# Patient Record
Sex: Female | Born: 1935 | Race: White | Hispanic: No | Marital: Married | Smoking: Former smoker
Health system: Southern US, Community
[De-identification: ages and names within clinical notes are randomized; demographics above are authoritative.]

## PROBLEM LIST (undated history)

## (undated) DIAGNOSIS — F32A Depression, unspecified: Secondary | ICD-10-CM

## (undated) DIAGNOSIS — N200 Calculus of kidney: Secondary | ICD-10-CM

## (undated) DIAGNOSIS — I10 Essential (primary) hypertension: Secondary | ICD-10-CM

## (undated) DIAGNOSIS — E119 Type 2 diabetes mellitus without complications: Secondary | ICD-10-CM

## (undated) DIAGNOSIS — E7800 Pure hypercholesterolemia, unspecified: Secondary | ICD-10-CM

## (undated) DIAGNOSIS — M4800 Spinal stenosis, site unspecified: Secondary | ICD-10-CM

## (undated) DIAGNOSIS — G4730 Sleep apnea, unspecified: Secondary | ICD-10-CM

## (undated) DIAGNOSIS — F329 Major depressive disorder, single episode, unspecified: Secondary | ICD-10-CM

## (undated) DIAGNOSIS — Z860100 Personal history of colon polyps, unspecified: Secondary | ICD-10-CM

## (undated) DIAGNOSIS — H3530 Unspecified macular degeneration: Secondary | ICD-10-CM

## (undated) DIAGNOSIS — D649 Anemia, unspecified: Secondary | ICD-10-CM

## (undated) DIAGNOSIS — J449 Chronic obstructive pulmonary disease, unspecified: Secondary | ICD-10-CM

## (undated) DIAGNOSIS — E039 Hypothyroidism, unspecified: Secondary | ICD-10-CM

## (undated) HISTORY — PX: TONSILLECTOMY: SUR1361

## (undated) HISTORY — PX: DILATION AND CURETTAGE OF UTERUS: SHX78

## (undated) HISTORY — DX: Anemia, unspecified: D64.9

## (undated) HISTORY — PX: ABDOMINAL HYSTERECTOMY: SHX81

## (undated) HISTORY — PX: THYROID SURGERY: SHX805

## (undated) HISTORY — DX: Major depressive disorder, single episode, unspecified: F32.9

## (undated) HISTORY — DX: Pure hypercholesterolemia, unspecified: E78.00

## (undated) HISTORY — DX: Personal history of colonic polyps: Z86.010

## (undated) HISTORY — DX: Chronic obstructive pulmonary disease, unspecified: J44.9

## (undated) HISTORY — DX: Sleep apnea, unspecified: G47.30

## (undated) HISTORY — DX: Calculus of kidney: N20.0

## (undated) HISTORY — DX: Hypothyroidism, unspecified: E03.9

## (undated) HISTORY — DX: Essential (primary) hypertension: I10

## (undated) HISTORY — DX: Spinal stenosis, site unspecified: M48.00

## (undated) HISTORY — DX: Personal history of colon polyps, unspecified: Z86.0100

## (undated) HISTORY — DX: Depression, unspecified: F32.A

## (undated) HISTORY — DX: Unspecified macular degeneration: H35.30

## (undated) HISTORY — DX: Type 2 diabetes mellitus without complications: E11.9

---

## 1990-05-31 HISTORY — PX: CATARACT EXTRACTION: SUR2

## 1994-05-31 HISTORY — PX: OTHER SURGICAL HISTORY: SHX169

## 1997-07-09 ENCOUNTER — Ambulatory Visit (HOSPITAL_COMMUNITY): Admission: RE | Admit: 1997-07-09 | Discharge: 1997-07-09 | Payer: Self-pay | Admitting: Internal Medicine

## 1999-09-01 ENCOUNTER — Encounter: Payer: Self-pay | Admitting: Internal Medicine

## 1999-09-01 ENCOUNTER — Encounter: Admission: RE | Admit: 1999-09-01 | Discharge: 1999-09-01 | Payer: Self-pay | Admitting: Internal Medicine

## 2003-06-01 ENCOUNTER — Other Ambulatory Visit: Payer: Self-pay

## 2004-07-23 ENCOUNTER — Ambulatory Visit: Payer: Self-pay | Admitting: Internal Medicine

## 2005-04-10 ENCOUNTER — Other Ambulatory Visit: Payer: Self-pay

## 2005-04-10 ENCOUNTER — Emergency Department: Payer: Self-pay | Admitting: Emergency Medicine

## 2005-08-03 ENCOUNTER — Ambulatory Visit: Payer: Self-pay | Admitting: Internal Medicine

## 2005-09-23 ENCOUNTER — Ambulatory Visit: Payer: Self-pay | Admitting: Gastroenterology

## 2006-03-21 ENCOUNTER — Encounter: Payer: Self-pay | Admitting: Specialist

## 2006-03-31 ENCOUNTER — Encounter: Payer: Self-pay | Admitting: Specialist

## 2006-04-30 ENCOUNTER — Encounter: Payer: Self-pay | Admitting: Specialist

## 2006-05-31 ENCOUNTER — Encounter: Payer: Self-pay | Admitting: Specialist

## 2006-07-01 ENCOUNTER — Encounter: Payer: Self-pay | Admitting: Specialist

## 2006-08-09 ENCOUNTER — Ambulatory Visit: Payer: Self-pay | Admitting: Internal Medicine

## 2006-09-05 ENCOUNTER — Ambulatory Visit: Payer: Self-pay | Admitting: Gastroenterology

## 2006-10-05 ENCOUNTER — Ambulatory Visit: Payer: Self-pay | Admitting: Unknown Physician Specialty

## 2007-08-03 ENCOUNTER — Ambulatory Visit: Payer: Self-pay | Admitting: Physician Assistant

## 2007-08-15 ENCOUNTER — Inpatient Hospital Stay: Payer: Self-pay | Admitting: Internal Medicine

## 2007-09-19 ENCOUNTER — Ambulatory Visit: Payer: Self-pay | Admitting: Internal Medicine

## 2007-10-20 ENCOUNTER — Ambulatory Visit: Payer: Self-pay | Admitting: Unknown Physician Specialty

## 2007-10-30 ENCOUNTER — Ambulatory Visit: Payer: Self-pay | Admitting: Unknown Physician Specialty

## 2007-11-10 ENCOUNTER — Ambulatory Visit: Payer: Self-pay | Admitting: Gastroenterology

## 2007-11-29 ENCOUNTER — Ambulatory Visit: Payer: Self-pay | Admitting: Unknown Physician Specialty

## 2007-12-16 ENCOUNTER — Ambulatory Visit: Payer: Self-pay | Admitting: Internal Medicine

## 2008-03-13 ENCOUNTER — Inpatient Hospital Stay: Payer: Self-pay | Admitting: Internal Medicine

## 2009-02-10 ENCOUNTER — Ambulatory Visit: Payer: Self-pay | Admitting: Internal Medicine

## 2009-05-08 ENCOUNTER — Ambulatory Visit: Payer: Self-pay | Admitting: Specialist

## 2009-06-05 LAB — HM PAP SMEAR: HM Pap smear: NEGATIVE

## 2010-06-05 LAB — HM DEXA SCAN

## 2010-07-28 ENCOUNTER — Ambulatory Visit: Payer: Self-pay | Admitting: Gastroenterology

## 2010-07-28 LAB — HM COLONOSCOPY

## 2010-07-30 LAB — PATHOLOGY REPORT

## 2011-04-25 ENCOUNTER — Inpatient Hospital Stay: Payer: Self-pay | Admitting: Internal Medicine

## 2011-04-25 ENCOUNTER — Ambulatory Visit: Payer: Self-pay

## 2011-07-02 ENCOUNTER — Ambulatory Visit: Payer: Self-pay | Admitting: Internal Medicine

## 2011-07-07 ENCOUNTER — Ambulatory Visit: Payer: Self-pay | Admitting: Internal Medicine

## 2011-07-07 LAB — HM MAMMOGRAPHY

## 2012-02-16 ENCOUNTER — Ambulatory Visit: Payer: Self-pay | Admitting: Surgery

## 2012-03-01 ENCOUNTER — Ambulatory Visit: Payer: Self-pay | Admitting: Internal Medicine

## 2012-03-06 ENCOUNTER — Ambulatory Visit: Payer: Self-pay

## 2012-03-06 LAB — URINALYSIS, COMPLETE
Nitrite: NEGATIVE
Ph: 5 (ref 4.5–8.0)
Protein: NEGATIVE

## 2012-03-08 LAB — URINE CULTURE

## 2012-03-09 ENCOUNTER — Other Ambulatory Visit: Payer: Self-pay | Admitting: *Deleted

## 2012-03-09 MED ORDER — FENOFIBRATE 160 MG PO TABS: 160.0000 mg | ORAL_TABLET | Freq: Every day | ORAL | Status: DC

## 2012-03-09 MED ORDER — LISINOPRIL 40 MG PO TABS: 40.0000 mg | ORAL_TABLET | Freq: Every day | ORAL | Status: DC

## 2012-03-09 MED ORDER — LEVOTHYROXINE SODIUM 25 MCG PO TABS: 25.0000 ug | ORAL_TABLET | Freq: Every day | ORAL | Status: DC

## 2012-03-09 NOTE — Telephone Encounter (Signed)
Faxed refill request received from pharmacy for LEVOTHYROXINE, LISINOPRIL, FENOFIBRATE Follow up 03/21/12 RX sent per faxed order signed by Dr.Scott.

## 2012-03-15 ENCOUNTER — Ambulatory Visit: Payer: Self-pay | Admitting: Family Medicine

## 2012-03-15 LAB — URINALYSIS, COMPLETE
Bilirubin,UR: NEGATIVE
Glucose,UR: NEGATIVE mg/dL (ref 0–75)
Protein: NEGATIVE
Specific Gravity: 1.01 (ref 1.003–1.030)

## 2012-03-16 LAB — URINE CULTURE

## 2012-03-21 ENCOUNTER — Ambulatory Visit: Payer: Self-pay | Admitting: Internal Medicine

## 2012-03-23 ENCOUNTER — Ambulatory Visit: Payer: Self-pay | Admitting: Surgery

## 2012-03-24 ENCOUNTER — Ambulatory Visit: Payer: Self-pay | Admitting: Internal Medicine

## 2012-04-10 ENCOUNTER — Telehealth: Payer: Self-pay | Admitting: Internal Medicine

## 2012-04-10 NOTE — Telephone Encounter (Signed)
Called pt and discussed.  Is seeing Dr Katrinka Blazing.  Planning for breast surgery.  Informed her to let Dr Meredeth Ide know of upcoming surgery - for pre op recs.  Also informed her to contact Dr Gwen Pounds for cardiac pre op.

## 2012-04-10 NOTE — Telephone Encounter (Signed)
Pt was wanting a call back concerning surgery that is coming up.

## 2012-04-12 ENCOUNTER — Ambulatory Visit: Payer: Self-pay | Admitting: Surgery

## 2012-04-13 ENCOUNTER — Telehealth: Payer: Self-pay | Admitting: Internal Medicine

## 2012-04-13 NOTE — Telephone Encounter (Signed)
Daughter took her to acute care, they gave patient Augmentin. Patient was up at 1:00am  With vomiting , diarrhea, nausea. Daughter wants to know if you could sent her in something else for cough, congestin, sore throat. Daughter unable to bring patient in because of work. [facility] can deliver. Patient is to have surgery on 11/20. Daughter would like a call back before 2:45. Please advise

## 2012-04-13 NOTE — Telephone Encounter (Signed)
Caller: Sharon/Child; Phone: [phone]; Reason for Call: Daughter wanting to speak with someone regarding medications and visit yesterday to the acute care because patient is not able to come in today since no one can bring her.

## 2012-04-13 NOTE — Telephone Encounter (Signed)
If was seen at Acute Care - needs to call acute care and let them know what problems having with med and they can change her.  Since they saw her - they will know what would be the best med to change to.

## 2012-04-13 NOTE — Telephone Encounter (Signed)
Called patients daughter back to inform her to call acute care. She stated that she would.

## 2012-04-13 NOTE — Telephone Encounter (Signed)
Please call daughter and find out what information and what questions they have.

## 2012-04-24 ENCOUNTER — Telehealth: Payer: Self-pay | Admitting: Internal Medicine

## 2012-04-24 NOTE — Telephone Encounter (Signed)
Cell # [phone] Jasmine December pt daughter called wanted to see if we could r/s her appointment to 05/03/12.   You only have 3 openings for that day left

## 2012-04-24 NOTE — Telephone Encounter (Signed)
You can move her to 12:00 on 05/03/12.  Thanks.

## 2012-04-24 NOTE — Telephone Encounter (Signed)
Pt daughter aware of appointment date and time change

## 2012-04-28 DIAGNOSIS — R002 Palpitations: Secondary | ICD-10-CM | POA: Insufficient documentation

## 2012-04-28 DIAGNOSIS — G4730 Sleep apnea, unspecified: Secondary | ICD-10-CM | POA: Insufficient documentation

## 2012-04-28 DIAGNOSIS — I10 Essential (primary) hypertension: Secondary | ICD-10-CM

## 2012-04-28 DIAGNOSIS — E119 Type 2 diabetes mellitus without complications: Secondary | ICD-10-CM | POA: Insufficient documentation

## 2012-04-28 DIAGNOSIS — E785 Hyperlipidemia, unspecified: Secondary | ICD-10-CM

## 2012-05-01 ENCOUNTER — Ambulatory Visit: Payer: Self-pay | Admitting: Internal Medicine

## 2012-05-03 ENCOUNTER — Encounter: Payer: Self-pay | Admitting: Internal Medicine

## 2012-05-03 ENCOUNTER — Ambulatory Visit (INDEPENDENT_AMBULATORY_CARE_PROVIDER_SITE_OTHER): Payer: Medicare Other | Admitting: Internal Medicine

## 2012-05-03 VITALS — BP 130/62 | HR 100 | Temp 98.5°F | Ht 62.0 in | Wt 199.8 lb

## 2012-05-03 DIAGNOSIS — E785 Hyperlipidemia, unspecified: Secondary | ICD-10-CM

## 2012-05-03 DIAGNOSIS — E039 Hypothyroidism, unspecified: Secondary | ICD-10-CM

## 2012-05-03 DIAGNOSIS — F329 Major depressive disorder, single episode, unspecified: Secondary | ICD-10-CM

## 2012-05-03 DIAGNOSIS — E7800 Pure hypercholesterolemia, unspecified: Secondary | ICD-10-CM

## 2012-05-03 DIAGNOSIS — E119 Type 2 diabetes mellitus without complications: Secondary | ICD-10-CM

## 2012-05-03 DIAGNOSIS — G4730 Sleep apnea, unspecified: Secondary | ICD-10-CM

## 2012-05-03 DIAGNOSIS — R002 Palpitations: Secondary | ICD-10-CM

## 2012-05-03 DIAGNOSIS — J449 Chronic obstructive pulmonary disease, unspecified: Secondary | ICD-10-CM

## 2012-05-03 DIAGNOSIS — I10 Essential (primary) hypertension: Secondary | ICD-10-CM

## 2012-05-03 DIAGNOSIS — D649 Anemia, unspecified: Secondary | ICD-10-CM | POA: Insufficient documentation

## 2012-05-03 MED ORDER — PANTOPRAZOLE SODIUM 40 MG PO TBEC: 40.0000 mg | DELAYED_RELEASE_TABLET | Freq: Every day | ORAL | Status: DC

## 2012-05-03 NOTE — Patient Instructions (Addendum)
It was nice seeing you today.  Keep me posted regarding your upcoming procedure.

## 2012-05-03 NOTE — Assessment & Plan Note (Addendum)
On oxygen.  Sees Dr Meredeth Ide.  Feels her breathing is stable. Discussed with her today regarding the need for pulmonary evaluation/recs prior to her surgery.  Follow.  Continue current inhaler regimen.

## 2012-05-03 NOTE — Progress Notes (Signed)
Subjective:    Patient ID: Elizabeth [name], female    DOB: 01-05-1926, 78 y.o.   MRN: [id number]  HPI 78 year old female with past history of COPD on oxygen, diabetes, hypertension, hypercholesterolemia and hypothyroidism who comes in today for a scheduled follow up.  Accompanied by her daughter.  History obtained from both of them.  States overall she is doing better. Husband died this summer.  She appears to be coping better.  Still cries easily.  Sees Dr Elesa Massed for her psychiatry care.  Feels she is doing better.  More active around the house.  AM sugars averaging 130s and pm sugars 130-140.  No chest pain or tightness.  Breathing stable.  She was just recently diagnosed with breast cancer.  Planning for surgery soon.  Has seen Dr Gwen Pounds.  See his note for details.  Overall she feels she is doing well.   Past Medical History  Diagnosis Date  . Diabetes mellitus     diabetic retinopathy  . Hypertension   . Hypercholesterolemia   . Hypothyroidism   . Depression   . History of colonic polyps   . COPD (chronic obstructive pulmonary disease)   . Anemia   . Sleep apnea   . Spinal stenosis   . Nephrolithiasis   . Macular degeneration     Current Outpatient Prescriptions on File Prior to Visit  Medication Sig Dispense Refill  . albuterol-ipratropium (COMBIVENT) 18-103 MCG/ACT inhaler Inhale 2 puffs into the lungs 2 (two) times daily.      Marland Kitchen amLODipine (NORVASC) 2.5 MG tablet Take 2.5 mg by mouth daily.      Marland Kitchen aspirin 81 MG tablet Take 81 mg by mouth daily.      Marland Kitchen buPROPion (WELLBUTRIN) 100 MG tablet Take 100 mg by mouth 2 (two) times daily.      . Calcium Carb-Cholecalciferol (PRONUTRIENTS CALCIUM+D3 PO) Take by mouth.      . DOXEPIN HCL PO Take by mouth.      . fenofibrate 160 MG tablet Take 1 tablet (160 mg total) by mouth daily.  90 tablet  1  . fluticasone (FLONASE) 50 MCG/ACT nasal spray Place 2 sprays into the nose daily.      . Fluticasone-Salmeterol (ADVAIR) 250-50 MCG/DOSE AEPB  Inhale 1 puff into the lungs every 12 (twelve) hours.      . gabapentin (NEURONTIN) 100 MG capsule Take 100 mg by mouth daily.      . Glucose Blood (BAYER BREEZE 2 TEST VI) 1 strip by In Vitro route 2 (two) times daily. Dx code 250.01      . insulin aspart (NOVOLOG FLEXPEN) 100 UNIT/ML injection Inject into the skin 3 (three) times daily before meals. Inject 4 units at breakfast and 6 units at lunch and supper      . Insulin Pen Needle (B-D UF III MINI PEN NEEDLES) 31G X 5 MM MISC 1 each by Does not apply route 3 (three) times daily.      Marland Kitchen levothyroxine (LEVOTHROID) 25 MCG tablet Take 1 tablet (25 mcg total) by mouth daily.  90 tablet  1  . lisinopril (PRINIVIL,ZESTRIL) 40 MG tablet Take 1 tablet (40 mg total) by mouth daily.  90 tablet  1  . nystatin (MYCOSTATIN) 100000 UNIT/ML suspension Take 4 mLs by mouth 4 (four) times daily.      . pantoprazole (PROTONIX) 40 MG tablet Take 1 tablet (40 mg total) by mouth daily.  30 tablet  4  . pioglitazone (ACTOS) 45 MG  tablet Take 45 mg by mouth daily.      . Psyllium (METAMUCIL PO) Take by mouth.      . sulfamethoxazole-trimethoprim (BACTRIM DS) 800-160 MG per tablet Take 1 tablet by mouth 2 (two) times daily.      Marland Kitchen tiotropium (SPIRIVA) 18 MCG inhalation capsule Place 18 mcg into inhaler and inhale at bedtime.      Marland Kitchen venlafaxine XR (EFFEXOR-XR) 75 MG 24 hr capsule Take 75 mg by mouth daily.        Review of Systems Patient denies any headache, lightheadedness or dizziness.  No sinus or allergy symptoms.  No chest pain, tightness or palpitations.  No increased shortness of breath, cough or congestion.  No nausea or vomiting.  No acid reflux.  No abdominal pain or cramping.  No bowel change, such as diarrhea, constipation, BRBPR or melana.  No urine change.  Overall she feels she is doing relatively well.       Objective:   Physical Exam Filed Vitals:   05/03/12 1155  BP: 130/62  Pulse: 100  Temp: 98.5 F (66.65 C)   78 year old female in no  acute distress.   HEENT:  Nares - clear.  OP- without lesions or erythema.  NECK:  Supple, nontender.  No audible bruit.   HEART:  Appears to be regular. LUNGS:  Without crackles or wheezing audible.  Respirations even and unlabored.   RADIAL PULSE:  Equal bilaterally.  ABDOMEN:  Soft, nontender.  No audible abdominal bruit.   EXTREMITIES:  No increased edema to be present.                     Assessment & Plan:  CARDIOVASCULAR.  ECHO 11/12 revealed EF 55%, mild to moderate mitral and tricuspid regurgitation and mildly dilated right and left atrium.  Just saw Dr Gwen Pounds 04/12/12.  Per his note recent ETT showing average exercise tolerance with no evidence of ischemia.  Per his note, felt to be at low cardiac risk.  Will need close intra op and post op monitoring of heart rate and blood pressure to avoid extremes.    GI.  Colonoscopy 07/28/10 with tubular adenomatous polyps in the cecum.  Recommended follow up colonoscopy in three years.  Currently asymptomatic.    ABNORMAL MAMMOGRAM.  Had a mammogram 2/13 - BiRADS IV.  Follow up mammogram 8/13.  Seeing Dr Katrinka Blazing.  Biopsy - infiltrating mammary carcinoma of the right breast.  Planning for right partial mastectomy and subsequent radiation.    HEALTH MAINTENANCE.  Physical 06/24/11.  She is s/p hysterectomy.  Colonoscopy as outlined.  Mammogram 2/13 - BiRADS IV.  Referred to Dr Katrinka Blazing.  Had a follow up mammogram 8/13.  See above.

## 2012-05-03 NOTE — Assessment & Plan Note (Addendum)
Sees Dr Hyacinth Meeker.  Up to date with eye exams.  Last urine microalbumin/cr ratio wnl.  Follow met b and a1c. Sugars as outlined (per her report).  Follow.

## 2012-05-03 NOTE — Assessment & Plan Note (Signed)
Has seen GI.  Previous SIEP - negative M spike.  No active bleeding.  Follow cbc.

## 2012-05-03 NOTE — Assessment & Plan Note (Signed)
On thyroid replacement.  Check tsh.  

## 2012-05-03 NOTE — Assessment & Plan Note (Addendum)
Seeing Dr Ward.  Currently stable.  Follow.    

## 2012-05-07 ENCOUNTER — Encounter: Payer: Self-pay | Admitting: Internal Medicine

## 2012-05-07 NOTE — Assessment & Plan Note (Signed)
Low cholesterol diet and exercise.  She is off the Zetia.  Check lipid panel.

## 2012-05-07 NOTE — Assessment & Plan Note (Signed)
Continue CPAP.  

## 2012-05-07 NOTE — Assessment & Plan Note (Signed)
Not an issue for her now.  Follow.  

## 2012-05-07 NOTE — Assessment & Plan Note (Signed)
Blood pressure under good control.  Same meds.  Check metabolic panel with next labs.  Will need close intra op and post op monitoring of heart rate and blood pressure to avoid extremes.

## 2012-05-08 ENCOUNTER — Other Ambulatory Visit (INDEPENDENT_AMBULATORY_CARE_PROVIDER_SITE_OTHER): Payer: Medicare Other

## 2012-05-08 DIAGNOSIS — E119 Type 2 diabetes mellitus without complications: Secondary | ICD-10-CM

## 2012-05-08 DIAGNOSIS — E039 Hypothyroidism, unspecified: Secondary | ICD-10-CM

## 2012-05-08 DIAGNOSIS — N390 Urinary tract infection, site not specified: Secondary | ICD-10-CM

## 2012-05-08 DIAGNOSIS — E7800 Pure hypercholesterolemia, unspecified: Secondary | ICD-10-CM

## 2012-05-08 DIAGNOSIS — D649 Anemia, unspecified: Secondary | ICD-10-CM

## 2012-05-08 LAB — CBC WITH DIFFERENTIAL/PLATELET
Basophils Relative: 1.8 % (ref 0.0–3.0)
Eosinophils Absolute: 0.2 10*3/uL (ref 0.0–0.7)
Hemoglobin: 10.3 g/dL — ABNORMAL LOW (ref 12.0–15.0)
Lymphs Abs: 1.2 10*3/uL (ref 0.7–4.0)
MCHC: 31.8 g/dL (ref 30.0–36.0)
MCV: 90.2 fl (ref 78.0–100.0)
Monocytes Absolute: 0.5 10*3/uL (ref 0.1–1.0)
Neutro Abs: 3.2 10*3/uL (ref 1.4–7.7)
RBC: 3.59 Mil/uL — ABNORMAL LOW (ref 3.87–5.11)

## 2012-05-08 LAB — HEPATIC FUNCTION PANEL
ALT: 17 U/L (ref 0–35)
AST: 21 U/L (ref 0–37)
Albumin: 3.7 g/dL (ref 3.5–5.2)
Alkaline Phosphatase: 48 U/L (ref 39–117)

## 2012-05-08 LAB — BASIC METABOLIC PANEL
BUN: 33 mg/dL — ABNORMAL HIGH (ref 6–23)
Creatinine, Ser: 1.2 mg/dL (ref 0.4–1.2)
GFR: 45.84 mL/min — ABNORMAL LOW (ref >60.00)

## 2012-05-08 LAB — LIPID PANEL
Cholesterol: 209 mg/dL — ABNORMAL HIGH (ref 0–200)
Triglycerides: 142 mg/dL (ref 0.0–149.0)

## 2012-05-08 LAB — TSH: TSH: 3.49 u[IU]/mL (ref 0.35–5.50)

## 2012-05-08 LAB — POCT URINALYSIS DIPSTICK
Bilirubin, UA: NEGATIVE
Blood, UA: NEGATIVE
Nitrite, UA: NEGATIVE
Spec Grav, UA: 1.02
Urobilinogen, UA: 0.2
pH, UA: 5.5

## 2012-05-08 LAB — HEMOGLOBIN A1C: Hgb A1c MFr Bld: 7.2 % — ABNORMAL HIGH (ref 4.6–6.5)

## 2012-05-08 NOTE — Progress Notes (Signed)
Called to let her know, spoke to daughter. She said that her Mother was doing weel.

## 2012-05-08 NOTE — Addendum Note (Signed)
Addended by: Montine Circle D on: 05/08/2012 08:41 AM   Modules accepted: Orders

## 2012-05-11 ENCOUNTER — Ambulatory Visit: Payer: Self-pay | Admitting: Surgery

## 2012-05-12 LAB — PATHOLOGY REPORT

## 2012-05-19 ENCOUNTER — Telehealth: Payer: Self-pay | Admitting: Internal Medicine

## 2012-05-19 NOTE — Telephone Encounter (Signed)
Patient Information:  Caller Name: Elizabeth [name]  Phone: [phone]  Patient: Elizabeth [name], Elizabeth [name]  Gender: Female  DOB: September 27, 1925  Age: 78 Years  PCP: Dale Folsom  Office Follow Up:  Does the office need to follow up with this patient?: Yes  Instructions For The Office: Pt refusing to be seen in ED or office.  Insisiting note be sent to MD requesting Levaquin and Prednisone be called in.  Said MD knows her and she was just in last week.  Says she is not able to come in today, has to go to see her cancer doctor today.  Pt tearful saying she can't come in for visit.  Pharmacy is [facility] [phone].  Triager advised pt that office policy states that no antibiotics will be called in, that pt must be seen for visit.  Pt insisiting note be sent.  OFFICE PLEASE CALL PT BACK AT [phone] OR [phone] (SON'S NUMBER WHO IS TAKING HER TO CANCER DOCTOR TODAY) TO LET HER KNOW IF MD WILL CALL IN MEDICATION.  THANKS.  RN Note:  Pt refuses to come in to see MD said she has to go to her cancer doctor today.  Said Dr. Lorin Picket knows her, she was just in last week.  Requesting Levaquin and Prednisone be called in.  Triager advised pt that no antibiotics will be called in without office visit.  Pt insisting on note being sent to MD.  Symptoms  Reason For Call & Symptoms: Coughing, with trouble breathing.  Coughing up small amount of colored sputum.  Reviewed Health History In EMR: Yes  Reviewed Medications In EMR: Yes  Reviewed Allergies In EMR: Yes  Reviewed Surgeries / Procedures: Yes  Date of Onset of Symptoms: 05/14/2012  Treatments Tried: inhalers, and neb machine  Treatments Tried Worked: Yes  Guideline(s) Used:  Cough  Disposition Per Guideline:   Go to ED Now  Reason For Disposition Reached:   Difficulty breathing  Advice Given:  N/A  Patient Refused Recommendation:  Patient Requests Prescription  Pt requesting antibiotics (Levaquin and Prednisone)

## 2012-05-19 NOTE — Telephone Encounter (Signed)
Called patients daughter, gave her message that Dr. Lorin Picket would not fill unless she is seen.

## 2012-05-26 ENCOUNTER — Ambulatory Visit: Payer: Self-pay | Admitting: Oncology

## 2012-06-04 ENCOUNTER — Telehealth: Payer: Self-pay | Admitting: Internal Medicine

## 2012-06-04 MED ORDER — LEVOTHYROXINE SODIUM 25 MCG PO TABS: 25.0000 ug | ORAL_TABLET | Freq: Every day | ORAL | Status: DC

## 2012-06-04 MED ORDER — LISINOPRIL 40 MG PO TABS: 40.0000 mg | ORAL_TABLET | Freq: Every day | ORAL | Status: DC

## 2012-06-04 MED ORDER — AMLODIPINE BESYLATE 2.5 MG PO TABS: 2.5000 mg | ORAL_TABLET | Freq: Every day | ORAL | Status: DC

## 2012-06-04 MED ORDER — FENOFIBRATE 160 MG PO TABS: 160.0000 mg | ORAL_TABLET | Freq: Every day | ORAL | Status: DC

## 2012-06-04 NOTE — Telephone Encounter (Signed)
[facility] for amlodipine, levothyroxine, fenofibrate and lisinopril

## 2012-06-08 ENCOUNTER — Ambulatory Visit: Payer: Self-pay | Admitting: Oncology

## 2012-06-20 LAB — FOLATE: Folic Acid: 13.1 ng/mL (ref 3.1–100.0)

## 2012-06-20 LAB — IRON AND TIBC
Iron Saturation: 13 %
Iron: 61 ug/dL (ref 50–170)

## 2012-06-20 LAB — CBC CANCER CENTER
Basophil #: 0.1 x10 3/mm (ref 0.0–0.1)
Basophil %: 0.6 %
Eosinophil #: 0.3 x10 3/mm (ref 0.0–0.7)
Eosinophil %: 3.4 %
HCT: 33.3 % — ABNORMAL LOW (ref 35.0–47.0)
HGB: 10.8 g/dL — ABNORMAL LOW (ref 12.0–16.0)
Lymphocyte #: 1.4 x10 3/mm (ref 1.0–3.6)
Lymphocyte %: 16.6 %
MCH: 28.7 pg (ref 26.0–34.0)
Neutrophil #: 5.8 x10 3/mm (ref 1.4–6.5)
Neutrophil %: 69.3 %
Platelet: 326 x10 3/mm (ref 150–440)
WBC: 8.3 x10 3/mm (ref 3.6–11.0)

## 2012-06-20 LAB — FERRITIN: Ferritin (ARMC): 35 ng/mL (ref 8–388)

## 2012-06-20 LAB — LACTATE DEHYDROGENASE: LDH: 194 U/L (ref 81–246)

## 2012-06-21 ENCOUNTER — Other Ambulatory Visit: Payer: Self-pay | Admitting: Internal Medicine

## 2012-06-21 MED ORDER — FLUTICASONE PROPIONATE 50 MCG/ACT NA SUSP: 2.0000 | Freq: Every day | NASAL | Status: DC

## 2012-06-21 NOTE — Telephone Encounter (Signed)
Sent in to pharmacy.  

## 2012-06-21 NOTE — Telephone Encounter (Signed)
fluticasone (FLONASE) 50 MCG/ACT nasal spray

## 2012-06-29 LAB — CBC CANCER CENTER
Basophil #: 0 x10 3/mm (ref 0.0–0.1)
Basophil %: 0.7 %
Eosinophil %: 3.6 %
HCT: 31.4 % — ABNORMAL LOW (ref 35.0–47.0)
Lymphocyte #: 1.2 x10 3/mm (ref 1.0–3.6)
Lymphocyte %: 18.5 %
MCH: 28.5 pg (ref 26.0–34.0)
MCHC: 32.3 g/dL (ref 32.0–36.0)
MCV: 88 fL (ref 80–100)
Monocyte #: 0.6 x10 3/mm (ref 0.2–0.9)
Monocyte %: 9.2 %
RBC: 3.56 10*6/uL — ABNORMAL LOW (ref 3.80–5.20)
WBC: 6.7 x10 3/mm (ref 3.6–11.0)

## 2012-06-30 ENCOUNTER — Ambulatory Visit (INDEPENDENT_AMBULATORY_CARE_PROVIDER_SITE_OTHER): Payer: Medicare Other | Admitting: Internal Medicine

## 2012-06-30 ENCOUNTER — Encounter: Payer: Self-pay | Admitting: Internal Medicine

## 2012-06-30 VITALS — BP 154/68 | HR 88 | Temp 98.2°F | Ht 62.75 in | Wt 204.0 lb

## 2012-06-30 DIAGNOSIS — M549 Dorsalgia, unspecified: Secondary | ICD-10-CM

## 2012-06-30 DIAGNOSIS — J449 Chronic obstructive pulmonary disease, unspecified: Secondary | ICD-10-CM

## 2012-06-30 DIAGNOSIS — H5310 Unspecified subjective visual disturbances: Secondary | ICD-10-CM

## 2012-06-30 DIAGNOSIS — I10 Essential (primary) hypertension: Secondary | ICD-10-CM

## 2012-07-01 ENCOUNTER — Ambulatory Visit: Payer: Self-pay | Admitting: Oncology

## 2012-07-02 ENCOUNTER — Encounter: Payer: Self-pay | Admitting: Internal Medicine

## 2012-07-02 NOTE — Assessment & Plan Note (Signed)
Breathing stable.

## 2012-07-02 NOTE — Assessment & Plan Note (Signed)
Blood pressure has been stable.  A little elevated initially on exam.  Recheck 132/68.  Follow.  Try to avoid extremes.

## 2012-07-02 NOTE — Progress Notes (Signed)
Subjective:    Patient ID: Elizabeth [name], female    DOB: January 23, 1926, 79 y.o.   MRN: [id number]  Back Pain  79 year old female with past history of COPD on oxygen, diabetes, hypertension, hypercholesterolemia and hypothyroidism who comes in today as a work in with concerns regarding back pain.   She has had back pain for years.  States it intermittently flares.  Has been taking some occasional tylenol.  Does help most of the time.  She is receiving her XRT treatments now.  Having to lie on the table.  Aggravates.  No pain radiating down her legs.  No chest pain or tightness.  States the pain is worsened with arm movements.  She also reports noticing a "film" over her eyes.  Noticed worse this am.  She has macular degeneration.  Has trouble seeing clearly.  Put her eye drops in this am and noticed "worsening film" after eye drops placed.  Has remained.  Both eyes.  No heacache.  No vision loss. No slurring of speech.  No other focal neuro changes.    Past Medical History  Diagnosis Date  . Diabetes mellitus     diabetic retinopathy  . Hypertension   . Hypercholesterolemia   . Hypothyroidism   . Depression   . History of colonic polyps   . COPD (chronic obstructive pulmonary disease)   . Anemia   . Sleep apnea   . Spinal stenosis   . Nephrolithiasis   . Macular degeneration     Current Outpatient Prescriptions on File Prior to Visit  Medication Sig Dispense Refill  . albuterol-ipratropium (COMBIVENT) 18-103 MCG/ACT inhaler Inhale 2 puffs into the lungs 2 (two) times daily.      Marland Kitchen amLODipine (NORVASC) 2.5 MG tablet Take 1 tablet (2.5 mg total) by mouth daily.  90 tablet  3  . aspirin 81 MG tablet Take 81 mg by mouth daily.      Marland Kitchen buPROPion (WELLBUTRIN) 100 MG tablet Take 100 mg by mouth 2 (two) times daily.      . Calcium Carb-Cholecalciferol (PRONUTRIENTS CALCIUM+D3 PO) Take by mouth.      . DOXEPIN HCL PO Take by mouth.      . fenofibrate 160 MG tablet Take 1 tablet (160 mg total) by  mouth daily.  90 tablet  1  . fluticasone (FLONASE) 50 MCG/ACT nasal spray Place 2 sprays into the nose daily.  16 g  1  . Fluticasone-Salmeterol (ADVAIR) 250-50 MCG/DOSE AEPB Inhale 1 puff into the lungs every 12 (twelve) hours.      . gabapentin (NEURONTIN) 100 MG capsule Take 100 mg by mouth daily.      . Glucose Blood (BAYER BREEZE 2 TEST VI) 1 strip by In Vitro route 2 (two) times daily. Dx code 250.01      . insulin aspart (NOVOLOG FLEXPEN) 100 UNIT/ML injection Inject into the skin 3 (three) times daily before meals. Inject 4 units at breakfast and 6 units at lunch and supper      . Insulin Pen Needle (B-D UF III MINI PEN NEEDLES) 31G X 5 MM MISC 1 each by Does not apply route 3 (three) times daily.      Marland Kitchen levothyroxine (LEVOTHROID) 25 MCG tablet Take 1 tablet (25 mcg total) by mouth daily.  90 tablet  3  . lisinopril (PRINIVIL,ZESTRIL) 40 MG tablet Take 1 tablet (40 mg total) by mouth daily.  90 tablet  3  . nystatin (MYCOSTATIN) 100000 UNIT/ML suspension Take 4  mLs by mouth 4 (four) times daily.      . pantoprazole (PROTONIX) 40 MG tablet Take 1 tablet (40 mg total) by mouth daily.  30 tablet  4  . pioglitazone (ACTOS) 45 MG tablet Take 45 mg by mouth daily.      . Psyllium (METAMUCIL PO) Take by mouth.      . sulfamethoxazole-trimethoprim (BACTRIM DS) 800-160 MG per tablet Take 1 tablet by mouth 2 (two) times daily.      Marland Kitchen tiotropium (SPIRIVA) 18 MCG inhalation capsule Place 18 mcg into inhaler and inhale at bedtime.      Marland Kitchen venlafaxine XR (EFFEXOR-XR) 75 MG 24 hr capsule Take 75 mg by mouth daily.        Review of Systems  Musculoskeletal: Positive for back pain.  Patient denies any headache, lightheadedness or dizziness.  Describes the film over her eyes as outlined.   No sinus or allergy symptoms.  No chest pain, tightness or palpitations.  No increased shortness of breath, cough or congestion.  No nausea or vomiting.  No acid reflux.  No abdominal pain or cramping.  No bowel change,  such as diarrhea, constipation, BRBPR or melana.  No urine change. Describes the back pain as outlined.       Objective:   Physical Exam  Filed Vitals:   06/30/12 1630  BP: 154/68  Pulse: 88  Temp: 98.2 F (20.61 C)   79 year old female in no acute distress.   HEENT:  Nares - clear.  OP- without lesions or erythema.  NECK:  Supple, nontender.  No audible bruit.   HEART:  Appears to be regular. LUNGS:  Without crackles or wheezing audible.  Respirations even and unlabored.   RADIAL PULSE:  Equal bilaterally.  ABDOMEN:  Soft, nontender.  No audible abdominal bruit.   EXTREMITIES:  No increased edema to be present.                     Assessment & Plan:  BACK PAIN.  See above.  No pain currently.  Took a pain pill.  Worsened with increased arm movements.  Has had xrays previously.  Will see if can arrange home health physical therapy.    VISUAL DISTURBANCE.  See above.  Unclear as to the exact etiology.  She has macular degeneration and has vision change at baseline.  Discussed further w/up.  With the acute change, discussed the possibility of TIA/CVA.  Discussed ER evaluation.  Wanted to refer over just to confirm nothing acute going on.  She declines.  She will monitor symptoms closely and if any change will be evaluated.  Will start ECASA 81mg  q day.  Will arrange opthalmology appt.   CARDIOVASCULAR.  ECHO 11/12 revealed EF 55%, mild to moderate mitral and tricuspid regurgitation and mildly dilated right and left atrium.  Just saw Dr Gwen Pounds 04/12/12.  Per his note recent ETT showing average exercise tolerance with no evidence of ischemia.  Currently stable.    GI.  Colonoscopy 07/28/10 with tubular adenomatous polyps in the cecum.  Recommended follow up colonoscopy in three years.  Currently asymptomatic.    ABNORMAL MAMMOGRAM.  Had a mammogram 2/13 - BiRADS IV.  Follow up mammogram 8/13.  Seeing Dr Katrinka Blazing.  Biopsy - infiltrating mammary carcinoma of the right breast.  S/P right  partial mastectomy.  Now receiving radiation treatments.  Was evaluated today.    HEALTH MAINTENANCE.  Physical 06/24/11.  She is s/p hysterectomy.  Colonoscopy as  outlined.  Mammogram 2/13 - BiRADS IV.  Referred to Dr Katrinka Blazing.  Had a follow up mammogram 8/13.  See above.   I spent over 30 minutes with this patient and more than 50% of the time was spent in consultation regarding the above.

## 2012-07-03 ENCOUNTER — Other Ambulatory Visit: Payer: Self-pay | Admitting: Internal Medicine

## 2012-07-03 DIAGNOSIS — H539 Unspecified visual disturbance: Secondary | ICD-10-CM

## 2012-07-03 NOTE — Progress Notes (Signed)
Order placed for opthalmology referral 

## 2012-07-19 ENCOUNTER — Other Ambulatory Visit: Payer: Self-pay | Admitting: *Deleted

## 2012-07-20 ENCOUNTER — Other Ambulatory Visit: Payer: Self-pay | Admitting: *Deleted

## 2012-07-20 LAB — CBC CANCER CENTER
Basophil #: 0 x10 3/mm (ref 0.0–0.1)
Eosinophil %: 2.5 %
HGB: 10.5 g/dL — ABNORMAL LOW (ref 12.0–16.0)
Lymphocyte %: 14.4 %
MCHC: 32.9 g/dL (ref 32.0–36.0)
MCV: 89 fL (ref 80–100)
Neutrophil #: 6 x10 3/mm (ref 1.4–6.5)
Neutrophil %: 71.8 %
Platelet: 255 x10 3/mm (ref 150–440)
RBC: 3.6 10*6/uL — ABNORMAL LOW (ref 3.80–5.20)
RDW: 15.4 % — ABNORMAL HIGH (ref 11.5–14.5)
WBC: 8.3 x10 3/mm (ref 3.6–11.0)

## 2012-07-20 MED ORDER — PANTOPRAZOLE SODIUM 40 MG PO TBEC: 40.0000 mg | DELAYED_RELEASE_TABLET | Freq: Every day | ORAL | Status: DC

## 2012-07-20 NOTE — Telephone Encounter (Signed)
Sent in to pharmacy.  

## 2012-07-24 LAB — CBC CANCER CENTER
Basophil #: 0 x10 3/mm (ref 0.0–0.1)
Eosinophil #: 0.2 x10 3/mm (ref 0.0–0.7)
HGB: 10.3 g/dL — ABNORMAL LOW (ref 12.0–16.0)
MCH: 28.6 pg (ref 26.0–34.0)
MCHC: 32 g/dL (ref 32.0–36.0)
MCV: 89 fL (ref 80–100)
Monocyte %: 9.2 %
Neutrophil #: 5.2 x10 3/mm (ref 1.4–6.5)
Platelet: 225 x10 3/mm (ref 150–440)

## 2012-07-24 NOTE — Telephone Encounter (Signed)
Already sent in to pharmacy. 

## 2012-07-26 ENCOUNTER — Other Ambulatory Visit: Payer: Self-pay | Admitting: *Deleted

## 2012-07-26 MED ORDER — FLUTICASONE PROPIONATE 50 MCG/ACT NA SUSP: 2.0000 | Freq: Every day | NASAL | Status: DC

## 2012-07-26 NOTE — Telephone Encounter (Signed)
Sent in to pharmacy again.

## 2012-07-29 ENCOUNTER — Ambulatory Visit: Payer: Self-pay | Admitting: Oncology

## 2012-07-31 ENCOUNTER — Telehealth: Payer: Self-pay | Admitting: Internal Medicine

## 2012-07-31 NOTE — Telephone Encounter (Addendum)
Patient needing refill on her  fluticasone (FLONASE) 50 MCG/ACT nasal spray  #90  pantoprazole (PROTONIX) 40 MG tablet   #90  Send to [facility] call in line # [phone]  Patient is out of her nasal spray

## 2012-08-01 MED ORDER — FLUTICASONE PROPIONATE 50 MCG/ACT NA SUSP: 2.0000 | Freq: Every day | NASAL | Status: DC

## 2012-08-01 MED ORDER — PANTOPRAZOLE SODIUM 40 MG PO TBEC: 40.0000 mg | DELAYED_RELEASE_TABLET | Freq: Every day | ORAL | Status: DC

## 2012-08-01 NOTE — Telephone Encounter (Signed)
Pt's daughter calling abck and saying her mother  Is completely out of meds and are needing those refilled. She says DO NOT call into [facility].

## 2012-08-01 NOTE — Telephone Encounter (Signed)
Called patient back to see if she wants a prescription sent int to local pharmacy?

## 2012-08-01 NOTE — Telephone Encounter (Signed)
[facility].  Does she need a local rx called in to get her through until she gets her mail order

## 2012-08-09 ENCOUNTER — Encounter: Payer: Medicare Other | Admitting: Internal Medicine

## 2012-08-09 ENCOUNTER — Telehealth: Payer: Self-pay | Admitting: *Deleted

## 2012-08-09 NOTE — Telephone Encounter (Signed)
Called patient to see if there was anything wrong since she missed her appointment. Patient stated that she did not have her appointment written down so she did not remember. Patient said that she would call back to reschedule appointment due to her daughter having surgery in the next few weeks, she wants to check with her first. Patient wanted to let you know that she has been feeling very depressed lately and she just does not feel good.

## 2012-08-09 NOTE — Telephone Encounter (Signed)
Called pt.  She has been doing daily radiation.  Is tired from this.  Doing her therapy.  She thinks this is helping her back.  Regarding her depression, she states there has been a lot going on.  She plans to follow up with Dr Elesa Massed - her psychiatrist.  No suicidal thoughts.   Will call back if she needs anything or to reschedule.

## 2012-08-29 ENCOUNTER — Ambulatory Visit: Payer: Self-pay | Admitting: Oncology

## 2012-09-14 ENCOUNTER — Telehealth: Payer: Self-pay | Admitting: Emergency Medicine

## 2012-09-14 DIAGNOSIS — E119 Type 2 diabetes mellitus without complications: Secondary | ICD-10-CM

## 2012-09-14 DIAGNOSIS — E7800 Pure hypercholesterolemia, unspecified: Secondary | ICD-10-CM

## 2012-09-14 DIAGNOSIS — E039 Hypothyroidism, unspecified: Secondary | ICD-10-CM

## 2012-09-14 DIAGNOSIS — I10 Essential (primary) hypertension: Secondary | ICD-10-CM

## 2012-09-14 NOTE — Telephone Encounter (Addendum)
Pt call and requesting a lab panel since it has been awhile. Please advise. Pt has been in cancer treatment. Can we please take away the no show charge for this pt as well.

## 2012-09-15 NOTE — Telephone Encounter (Signed)
I ordered labs.  Please schedule pt an appt time to come in for labs.  I am ok to take away the no show charge, will need to ask shannon how we do this.  Thanks.

## 2012-09-18 NOTE — Telephone Encounter (Signed)
I have sent an e-mail to charge correction asking them to remove the NO show fee from 08/09/12.

## 2012-09-18 NOTE — Telephone Encounter (Signed)
Called patient to set lab appointment but pt has to ask her daughter on a day that she can bring the pt for her labs

## 2012-09-21 NOTE — Telephone Encounter (Signed)
Pt has appointment this Monday for labs and follow up

## 2012-09-25 ENCOUNTER — Encounter: Payer: Self-pay | Admitting: Internal Medicine

## 2012-09-25 ENCOUNTER — Ambulatory Visit (INDEPENDENT_AMBULATORY_CARE_PROVIDER_SITE_OTHER): Payer: Medicare Other | Admitting: Internal Medicine

## 2012-09-25 ENCOUNTER — Telehealth: Payer: Self-pay | Admitting: Internal Medicine

## 2012-09-25 VITALS — BP 130/70 | HR 108 | Temp 100.2°F | Ht 62.75 in | Wt 204.5 lb

## 2012-09-25 DIAGNOSIS — F32A Depression, unspecified: Secondary | ICD-10-CM

## 2012-09-25 DIAGNOSIS — E119 Type 2 diabetes mellitus without complications: Secondary | ICD-10-CM

## 2012-09-25 DIAGNOSIS — J449 Chronic obstructive pulmonary disease, unspecified: Secondary | ICD-10-CM

## 2012-09-25 DIAGNOSIS — E039 Hypothyroidism, unspecified: Secondary | ICD-10-CM

## 2012-09-25 DIAGNOSIS — I10 Essential (primary) hypertension: Secondary | ICD-10-CM

## 2012-09-25 DIAGNOSIS — E785 Hyperlipidemia, unspecified: Secondary | ICD-10-CM

## 2012-09-25 DIAGNOSIS — G4730 Sleep apnea, unspecified: Secondary | ICD-10-CM

## 2012-09-25 DIAGNOSIS — R509 Fever, unspecified: Secondary | ICD-10-CM

## 2012-09-25 DIAGNOSIS — F329 Major depressive disorder, single episode, unspecified: Secondary | ICD-10-CM

## 2012-09-25 DIAGNOSIS — D649 Anemia, unspecified: Secondary | ICD-10-CM

## 2012-09-25 LAB — CBC WITH DIFFERENTIAL/PLATELET
Basophils Absolute: 0 10*3/uL (ref 0.0–0.1)
HCT: 31.7 % — ABNORMAL LOW (ref 36.0–46.0)
Lymphs Abs: 0.9 10*3/uL (ref 0.7–4.0)
Monocytes Absolute: 0.7 10*3/uL (ref 0.1–1.0)
Monocytes Relative: 8.3 % (ref 3.0–12.0)
Platelets: 294 10*3/uL (ref 150.0–400.0)
RDW: 15.5 % — ABNORMAL HIGH (ref 11.5–14.6)

## 2012-09-25 NOTE — Patient Instructions (Signed)
Can take kaopectate after each loose stool.  Do not exceed more than 6 doses in a 24 hour period.   Would get some align - one per day.

## 2012-09-25 NOTE — Telephone Encounter (Signed)
Would hold on sudafed.  Would recommend continuing flonase and saline nasal spray.  Ok to get the generic of claritin or allegra.

## 2012-09-25 NOTE — Telephone Encounter (Signed)
Daughter informed of Dr Roby Lofts recommendations

## 2012-09-25 NOTE — Telephone Encounter (Signed)
Elizabeth [name], daughter calling asking if it is ok for pt to take Sudafed. Seen today in office for unrelated issue but they forgot to ask what pt can take for allergies/congestion. Uses Fluticasone and has been told in the past to use Claritin or Allegra but daughter states ins will not cover it and pt can not afford it. Pt has many comorbidities and on many meds and they wanted to make sure Dr. Lorin Picket is ok with Pt taking Sudafed.   Elizabeth [name] requests a call back today or tomorrow regarding this issue. May LM on voicemail if she does not pick up.

## 2012-09-26 ENCOUNTER — Telehealth: Payer: Self-pay | Admitting: Internal Medicine

## 2012-09-26 ENCOUNTER — Ambulatory Visit: Payer: Medicare Other

## 2012-09-26 DIAGNOSIS — E119 Type 2 diabetes mellitus without complications: Secondary | ICD-10-CM

## 2012-09-26 LAB — HEMOGLOBIN A1C: Hgb A1c MFr Bld: 7.1 % — ABNORMAL HIGH (ref 4.6–6.5)

## 2012-09-26 NOTE — Telephone Encounter (Signed)
Ok.  Go ahead and add a1c 250.00.

## 2012-09-26 NOTE — Telephone Encounter (Signed)
She was supposed to have had met b, a1c, liver panel, lipid profile with the cbc.  Were these labs also drawn.  The only result I received was the cbc.  Let me know.  Any way to add labs?

## 2012-09-26 NOTE — Telephone Encounter (Signed)
i just collected the one you had for the visit i didn't know you want the Futures as well ...sorry

## 2012-09-26 NOTE — Telephone Encounter (Signed)
I can added the A1c ?

## 2012-09-27 ENCOUNTER — Encounter: Payer: Self-pay | Admitting: Internal Medicine

## 2012-09-27 NOTE — Progress Notes (Signed)
Subjective:    Patient ID: Elizabeth [name], female    DOB: [birth year], 79 y.o.   MRN: [id number] Fever   GI Problem The primary symptoms include fever.  The illness is also significant for back pain.  Back Pain Associated symptoms include a fever.  79 year old female with past history of COPD on oxygen, diabetes, hypertension, hypercholesterolemia and hypothyroidism who comes in today for a scheduled follow up.  She states she has been doing relatively well.  Did start over the last 24-48 hours with diarrhea.  No nausea or vomiting.  Breathing stable.  Able to eat.  Increased diarrhea.  No notice of any blood.  No abdominal pain or cramping.  No urinary change.  Ate a hamburger last night.  She reports sugars have been doing well.  Brought in no recorded readngs.  States Dr Aggie Cosier has released.  Still seeing Dr Orlie Dakin and Dr Katrinka Blazing.  Is taking iron and trying to eat liver, etc.     Past Medical History  Diagnosis Date  . Diabetes mellitus     diabetic retinopathy  . Hypertension   . Hypercholesterolemia   . Hypothyroidism   . Depression   . History of colonic polyps   . COPD (chronic obstructive pulmonary disease)   . Anemia   . Sleep apnea   . Spinal stenosis   . Nephrolithiasis   . Macular degeneration     Current Outpatient Prescriptions on File Prior to Visit  Medication Sig Dispense Refill  . albuterol-ipratropium (COMBIVENT) 18-103 MCG/ACT inhaler Inhale 2 puffs into the lungs 2 (two) times daily.      Marland Kitchen amLODipine (NORVASC) 2.5 MG tablet Take 1 tablet (2.5 mg total) by mouth daily.  90 tablet  3  . aspirin 81 MG tablet Take 81 mg by mouth daily.      Marland Kitchen buPROPion (WELLBUTRIN) 100 MG tablet Take 100 mg by mouth 2 (two) times daily.      . Calcium Carb-Cholecalciferol (PRONUTRIENTS CALCIUM+D3 PO) Take by mouth.      . DOXEPIN HCL PO Take by mouth.      . fenofibrate 160 MG tablet Take 1 tablet (160 mg total) by mouth daily.  90 tablet  1  . fluticasone (FLONASE) 50 MCG/ACT  nasal spray Place 2 sprays into the nose daily.  48 g  1  . Fluticasone-Salmeterol (ADVAIR) 250-50 MCG/DOSE AEPB Inhale 1 puff into the lungs every 12 (twelve) hours.      . gabapentin (NEURONTIN) 100 MG capsule Take 100 mg by mouth daily.      . Glucose Blood (BAYER BREEZE 2 TEST VI) 1 strip by In Vitro route 2 (two) times daily. Dx code 250.01      . HYDROcodone-acetaminophen (NORCO/VICODIN) 5-325 MG per tablet Take 1 tablet by mouth every 4 (four) hours as needed.      . insulin aspart (NOVOLOG FLEXPEN) 100 UNIT/ML injection Inject into the skin 3 (three) times daily before meals. Inject 4 units at breakfast and 6 units at lunch and supper      . Insulin Pen Needle (B-D UF III MINI PEN NEEDLES) 31G X 5 MM MISC 1 each by Does not apply route 3 (three) times daily.      Marland Kitchen levothyroxine (LEVOTHROID) 25 MCG tablet Take 1 tablet (25 mcg total) by mouth daily.  90 tablet  3  . lisinopril (PRINIVIL,ZESTRIL) 40 MG tablet Take 1 tablet (40 mg total) by mouth daily.  90 tablet  3  .  nystatin (MYCOSTATIN) 100000 UNIT/ML suspension Take 4 mLs by mouth 4 (four) times daily.      . pantoprazole (PROTONIX) 40 MG tablet Take 1 tablet (40 mg total) by mouth daily.  90 tablet  1  . pioglitazone (ACTOS) 45 MG tablet Take 45 mg by mouth daily.      . Psyllium (METAMUCIL PO) Take by mouth.      . tiotropium (SPIRIVA) 18 MCG inhalation capsule Place 18 mcg into inhaler and inhale at bedtime.      Marland Kitchen venlafaxine XR (EFFEXOR-XR) 75 MG 24 hr capsule Take 75 mg by mouth daily.       No current facility-administered medications on file prior to visit.    Review of Systems  Constitutional: Positive for fever.  Musculoskeletal: Positive for back pain.  Patient denies any headache, lightheadedness or dizziness.  No sinus or allergy symptoms.  No chest pain, tightness or palpitations.  No increased shortness of breath, cough or congestion.  No nausea or vomiting.  No abdominal pain or cramping.  Does report the increased  loose stool.  Ate last night.  Some increased gas.  No BRBPR or melana.  No urine change.          Objective:   Physical Exam  Filed Vitals:   09/25/12 0802  BP: 130/70  Pulse: 108  Temp: 100.2 F (37.9 C)   Pulse recheck:  63  79 year old female in no acute distress.   HEENT:  Nares - clear.  OP- without lesions or erythema.  NECK:  Supple, nontender.  No audible bruit.   HEART:  Appears to be regular. LUNGS:  Without crackles or wheezing audible.  Respirations even and unlabored.   RADIAL PULSE:  Equal bilaterally.  ABDOMEN:  Soft, nontender.  No audible abdominal bruit.   EXTREMITIES:  No increased edema to be present.                     Assessment & Plan:  DIARRHEA.  Symptoms and exam as outlined.  Had a couple of episodes of diarrhea this am.  Probable viral infection.  Discussed importance of staying hydrated.  Bland foods.  Can use kaopectate after each loose stool, not to exceed more than 6 doses in a 24 hour period.  Check cbc.  Follow.  Notify me if persistent problems.    BACK PAIN.  Stable.       CARDIOVASCULAR.  ECHO 11/12 revealed EF 55%, mild to moderate mitral and tricuspid regurgitation and mildly dilated right and left atrium.  Saw Dr Gwen Pounds 04/12/12.  Per his note recent ETT showing average exercise tolerance with no evidence of ischemia.  Currently stable.    GI.  Colonoscopy 07/28/10 with tubular adenomatous polyps in the cecum.  Recommended follow up colonoscopy in three years.    ABNORMAL MAMMOGRAM.  Had a mammogram 2/13 - BiRADS IV.  Follow up mammogram 8/13.  Seeing Dr Katrinka Blazing.  Biopsy - infiltrating mammary carcinoma of the right breast.  S/P right partial mastectomy.  S/p XRT.  Has been released by Dr Aggie Cosier.  Continues to follow up at the cancer center.    HEALTH MAINTENANCE.  Physical 06/24/11.  She is s/p hysterectomy.  Colonoscopy as outlined.  Mammogram 2/13 - BiRADS IV.  Referred to Dr Katrinka Blazing.  Had a follow up mammogram 8/13.  See above.

## 2012-09-28 ENCOUNTER — Telehealth: Payer: Self-pay

## 2012-09-28 NOTE — Telephone Encounter (Signed)
Patient Information:  Caller Name: Barrett  Phone: [phone]  Patient: Elizabeth [name], Elizabeth [name]  Gender: Female  DOB: 12-Feb-1926  Age: 79 Years  PCP: Dale [place]  Office Follow Up:  Does the office need to follow up with this patient?: No  Instructions For The Office: N/A  RN Note:  Advised patient to continue to take Kaopectate as needed after each loose stool, not to exceed 8 doses/day.  Symptoms  Reason For Call & Symptoms: Onset 09/24/2012 diarrhea and seen in office by Dr. Lorin Picket on 09/25/2012.  Dr. Lorin Picket advised patient to take Kaopectate and Probiotics.  Patient unable to get Kaopectate until 09/27/2012 and has had one dose of it.  Also had one loose stool today.  Reviewed Health History In EMR: Yes  Reviewed Medications In EMR: Yes  Reviewed Allergies In EMR: Yes  Reviewed Surgeries / Procedures: Yes  Date of Onset of Symptoms: 09/24/2012  Treatments Tried: Pepto Bismol and Probiotics.  Started Kaopectate on 09/27/2012.  Treatments Tried Worked: No  Any Fever: Yes  Fever Taken: Oral  Fever Time Of Reading: 13:00:00  Fever Last Reading: 100.1  Guideline(s) Used:  Diarrhea  Disposition Per Guideline:   Home Care  Reason For Disposition Reached:   Mild diarrhea  Advice Given:  Reassurance:  In healthy adults, new-onset diarrhea is usually caused by a viral infection of the intestines, which you can treat at home. Diarrhea is the body's way of getting rid of the infection. Here are some tips on how to keep ahead of the fluid losses.  Here is some care advice that should help.  Fluids:  Drink more fluids, at least 8-10 glasses (8 oz or 240 ml) daily.  For example: sports drinks, diluted fruit juices, soft drinks.  Supplement this with saltine crackers or soups to make certain that you are getting sufficient fluid and salt to meet your body's needs.  Avoid caffeinated beverages (Reason: caffeine is mildly dehydrating).  Nutrition:  Maintaining some food intake during  episodes of diarrhea is important.  Ideal initial foods include boiled starches/cereals (e.g., potatoes, rice, noodles, wheat, oats) with a small amount of salt to taste.  Other acceptable foods include: bananas, yogurt, crackers, soup.  As your stools return to normal consistency, resume a normal diet.  Diarrhea Medication - Bismuth Subsalicylate (e.g., Kaopectate, PeptoBismol):  Helps reduce diarrhea, vomiting, and abdominal cramping.  Adult dosage: 2 tablets or 2 tablespoons (30 ml) by mouth every hour if diarrhea continues to a maximum of 8 doses in a 24-hour period.  Do not use for more than 2 days  This medication can make the stools look dark or even black (but not red or tarry).  Expected Course:  Viral diarrhea lasts 4-7 days. Always worse on days 1 and 2.  Call Back If:  Signs of dehydration occur (e.g., no urine for more than 12 hours, very dry mouth, lightheaded, etc.)  Diarrhea lasts over 7 days  You become worse.  Patient Will Follow Care Advice:  YES

## 2012-09-28 NOTE — Telephone Encounter (Signed)
Patient called because she is still having diarrhea problems since her visit with you on Monday, pt stated she has did everything you suggested and she is still having diarrhea. She stated she had a fever this morning of 99.0 but she just checked it at 2:25PM and it was back to normal to 98.7. She wants to know what you would suggest for her to do. I told her that I can get her in with Raquel tomorrow but she wants to know what do you suggest.

## 2012-09-28 NOTE — Telephone Encounter (Signed)
It looks like cal a nurse has taken care of this message.  Please call and confirm she was not waiting on further notification from [place].  When I spoke to her last night she had not started the kaopectate.  I would recommend her take and stick with bland foods.  Call back if problems.  If any acute problems, she does need reeval  Today or tomorrow.

## 2012-09-29 NOTE — Telephone Encounter (Signed)
Pt states the she is doing better, & taking the Kaopectate, so far she has eaten a burger with no stomach issues (was tired of eating soup). Pt advised to call back after lunch if sx's return. Also advised pt to go to ER if sx's worsen over the weekend

## 2012-10-02 ENCOUNTER — Encounter: Payer: Self-pay | Admitting: Internal Medicine

## 2012-10-02 NOTE — Assessment & Plan Note (Signed)
Breathing stable.

## 2012-10-02 NOTE — Assessment & Plan Note (Signed)
On thyroid replacement.  Follow tsh.  

## 2012-10-02 NOTE — Assessment & Plan Note (Addendum)
Has seen GI.  Previous SIEP - negative M spike.  No active bleeding. Is being followed at the [facility].  Follow cbc.

## 2012-10-02 NOTE — Assessment & Plan Note (Signed)
Blood pressure under good control.  Same meds.  Check metabolic panel with next labs.    

## 2012-10-02 NOTE — Assessment & Plan Note (Signed)
Low cholesterol diet and exercise.  She is off the Zetia.  Check lipid panel.

## 2012-10-02 NOTE — Assessment & Plan Note (Signed)
Has been seeing Dr Hyacinth Meeker.  Up to date with eye exams.  Last urine microalbumin/cr ratio wnl.  Follow met b and a1c. Sugars doing well - (per her report).  Follow.

## 2012-10-02 NOTE — Assessment & Plan Note (Signed)
Seeing Dr Ward.  Currently stable.  Follow.    

## 2012-10-02 NOTE — Assessment & Plan Note (Signed)
Continue CPAP.  

## 2012-10-04 ENCOUNTER — Other Ambulatory Visit (INDEPENDENT_AMBULATORY_CARE_PROVIDER_SITE_OTHER): Payer: Medicare Other

## 2012-10-04 DIAGNOSIS — E039 Hypothyroidism, unspecified: Secondary | ICD-10-CM

## 2012-10-04 DIAGNOSIS — E7800 Pure hypercholesterolemia, unspecified: Secondary | ICD-10-CM

## 2012-10-04 DIAGNOSIS — E119 Type 2 diabetes mellitus without complications: Secondary | ICD-10-CM

## 2012-10-04 LAB — LDL CHOLESTEROL, DIRECT: Direct LDL: 66.8 mg/dL

## 2012-10-04 LAB — BASIC METABOLIC PANEL
Calcium: 9.5 mg/dL (ref 8.4–10.5)
Creatinine, Ser: 1 mg/dL (ref 0.4–1.2)
GFR: 53.5 mL/min — ABNORMAL LOW (ref >60.00)

## 2012-10-04 LAB — HEPATIC FUNCTION PANEL
AST: 19 U/L (ref 0–37)
Albumin: 3.6 g/dL (ref 3.5–5.2)
Alkaline Phosphatase: 63 U/L (ref 39–117)
Total Protein: 6.3 g/dL (ref 6.0–8.3)

## 2012-10-04 LAB — HEMOGLOBIN A1C: Hgb A1c MFr Bld: 7.1 % — ABNORMAL HIGH (ref 4.6–6.5)

## 2012-10-04 LAB — LIPID PANEL: Cholesterol: 208 mg/dL — ABNORMAL HIGH (ref 0–200)

## 2012-10-05 ENCOUNTER — Encounter: Payer: Self-pay | Admitting: *Deleted

## 2012-10-12 ENCOUNTER — Ambulatory Visit: Payer: Self-pay | Admitting: Oncology

## 2012-10-19 ENCOUNTER — Ambulatory Visit: Payer: Self-pay | Admitting: Surgery

## 2012-10-30 ENCOUNTER — Ambulatory Visit: Payer: Self-pay | Admitting: Oncology

## 2012-11-28 ENCOUNTER — Ambulatory Visit: Payer: Self-pay | Admitting: Oncology

## 2012-12-01 ENCOUNTER — Ambulatory Visit: Payer: Self-pay | Admitting: Family Medicine

## 2012-12-01 LAB — URINALYSIS, COMPLETE
Nitrite: NEGATIVE
Ph: 6 (ref 4.5–8.0)
RBC,UR: >30 /HPF (ref 0–5)

## 2012-12-04 ENCOUNTER — Inpatient Hospital Stay: Payer: Self-pay | Admitting: Internal Medicine

## 2012-12-04 LAB — PRO B NATRIURETIC PEPTIDE: B-Type Natriuretic Peptide: 2927 pg/mL — ABNORMAL HIGH (ref 0–450)

## 2012-12-04 LAB — CBC
HGB: 7.7 g/dL — ABNORMAL LOW (ref 12.0–16.0)
MCHC: 32.1 g/dL (ref 32.0–36.0)
MCV: 87 fL (ref 80–100)
Platelet: 222 10*3/uL (ref 150–440)
WBC: 11.2 10*3/uL — ABNORMAL HIGH (ref 3.6–11.0)

## 2012-12-04 LAB — BASIC METABOLIC PANEL
Anion Gap: 6 — ABNORMAL LOW (ref 7–16)
BUN: 38 mg/dL — ABNORMAL HIGH (ref 7–18)
Calcium, Total: 8.6 mg/dL (ref 8.5–10.1)
Chloride: 101 mmol/L (ref 98–107)
Co2: 30 mmol/L (ref 21–32)
Creatinine: 1.72 mg/dL — ABNORMAL HIGH (ref 0.60–1.30)
Potassium: 4.7 mmol/L (ref 3.5–5.1)
Sodium: 137 mmol/L (ref 136–145)

## 2012-12-04 LAB — IRON AND TIBC
Iron Bind.Cap.(Total): 438 ug/dL (ref 250–450)
Iron: 29 ug/dL — ABNORMAL LOW (ref 50–170)
Unbound Iron-Bind.Cap.: 409 ug/dL

## 2012-12-04 LAB — CK TOTAL AND CKMB (NOT AT ARMC)
CK, Total: 442 U/L — ABNORMAL HIGH (ref 21–215)
CK, Total: 484 U/L — ABNORMAL HIGH (ref 21–215)
CK-MB: 5.8 ng/mL — ABNORMAL HIGH (ref 0.5–3.6)
CK-MB: 5.5 ng/mL — ABNORMAL HIGH (ref 0.5–3.6)

## 2012-12-04 LAB — FERRITIN: Ferritin (ARMC): 83 ng/mL (ref 8–388)

## 2012-12-04 LAB — TROPONIN I: Troponin-I: 0.21 ng/mL — ABNORMAL HIGH

## 2012-12-05 LAB — BASIC METABOLIC PANEL
BUN: 52 mg/dL — ABNORMAL HIGH (ref 7–18)
Calcium, Total: 8.7 mg/dL (ref 8.5–10.1)
Chloride: 98 mmol/L (ref 98–107)
Co2: 35 mmol/L — ABNORMAL HIGH (ref 21–32)
Creatinine: 2.06 mg/dL — ABNORMAL HIGH (ref 0.60–1.30)
EGFR (African American): 25 — ABNORMAL LOW
EGFR (Non-African Amer.): 21 — ABNORMAL LOW
Osmolality: 289 (ref 275–301)
Potassium: 4.8 mmol/L (ref 3.5–5.1)
Sodium: 136 mmol/L (ref 136–145)

## 2012-12-05 LAB — CBC WITH DIFFERENTIAL/PLATELET
Basophil #: 0 10*3/uL (ref 0.0–0.1)
Basophil %: 0.2 %
HCT: 25 % — ABNORMAL LOW (ref 35.0–47.0)
MCH: 27.9 pg (ref 26.0–34.0)
Monocyte %: 2.8 %
Neutrophil #: 7.7 10*3/uL — ABNORMAL HIGH (ref 1.4–6.5)
Neutrophil %: 92.9 %
Platelet: 248 10*3/uL (ref 150–440)
RDW: 15.4 % — ABNORMAL HIGH (ref 11.5–14.5)

## 2012-12-05 LAB — PHOSPHORUS: Phosphorus: 4.8 mg/dL (ref 2.5–4.9)

## 2012-12-05 LAB — CK TOTAL AND CKMB (NOT AT ARMC): CK-MB: 7.2 ng/mL — ABNORMAL HIGH (ref 0.5–3.6)

## 2012-12-06 LAB — BASIC METABOLIC PANEL
BUN: 72 mg/dL — ABNORMAL HIGH (ref 7–18)
Calcium, Total: 9.1 mg/dL (ref 8.5–10.1)
Chloride: 96 mmol/L — ABNORMAL LOW (ref 98–107)
Co2: 35 mmol/L — ABNORMAL HIGH (ref 21–32)
Creatinine: 1.99 mg/dL — ABNORMAL HIGH (ref 0.60–1.30)
EGFR (African American): 26 — ABNORMAL LOW
EGFR (Non-African Amer.): 22 — ABNORMAL LOW
Glucose: 182 mg/dL — ABNORMAL HIGH (ref 65–99)
Potassium: 4.8 mmol/L (ref 3.5–5.1)

## 2012-12-06 LAB — CBC WITH DIFFERENTIAL/PLATELET
Eosinophil %: 0 %
HGB: 8.4 g/dL — ABNORMAL LOW (ref 12.0–16.0)
Lymphocyte %: 3.3 %
MCV: 86 fL (ref 80–100)
Monocyte #: 0.3 x10 3/mm (ref 0.2–0.9)
Neutrophil #: 9.3 10*3/uL — ABNORMAL HIGH (ref 1.4–6.5)
Neutrophil %: 93.4 %
RBC: 2.92 10*6/uL — ABNORMAL LOW (ref 3.80–5.20)
RDW: 15.1 % — ABNORMAL HIGH (ref 11.5–14.5)
WBC: 9.9 10*3/uL (ref 3.6–11.0)

## 2012-12-06 LAB — PROTEIN / CREATININE RATIO, URINE
Creatinine, Urine: 60.6 mg/dL (ref 30.0–125.0)
Protein, Random Urine: 25 mg/dL — ABNORMAL HIGH (ref 0–12)

## 2012-12-07 LAB — PROTEIN ELECTROPHORESIS(ARMC)

## 2012-12-07 LAB — CBC WITH DIFFERENTIAL/PLATELET
Basophil #: 0 10*3/uL (ref 0.0–0.1)
Basophil %: 0.1 %
Eosinophil #: 0.1 10*3/uL (ref 0.0–0.7)
Eosinophil %: 0.5 %
HGB: 9.1 g/dL — ABNORMAL LOW (ref 12.0–16.0)
Lymphocyte #: 1.1 10*3/uL (ref 1.0–3.6)
MCHC: 33 g/dL (ref 32.0–36.0)
Monocyte %: 11.5 %
Neutrophil #: 7.4 10*3/uL — ABNORMAL HIGH (ref 1.4–6.5)
Neutrophil %: 76.7 %
WBC: 9.7 10*3/uL (ref 3.6–11.0)

## 2012-12-07 LAB — BASIC METABOLIC PANEL
BUN: 69 mg/dL — ABNORMAL HIGH (ref 7–18)
Co2: 35 mmol/L — ABNORMAL HIGH (ref 21–32)
EGFR (African American): 30 — ABNORMAL LOW
Glucose: 83 mg/dL (ref 65–99)
Osmolality: 293 (ref 275–301)
Sodium: 137 mmol/L (ref 136–145)

## 2012-12-07 LAB — UR PROT ELECTROPHORESIS, URINE RANDOM

## 2012-12-08 ENCOUNTER — Telehealth: Payer: Self-pay | Admitting: Internal Medicine

## 2012-12-08 LAB — BASIC METABOLIC PANEL
Calcium, Total: 9.8 mg/dL (ref 8.5–10.1)
Chloride: 96 mmol/L — ABNORMAL LOW (ref 98–107)
Creatinine: 1.28 mg/dL (ref 0.60–1.30)
EGFR (African American): 44 — ABNORMAL LOW
Glucose: 125 mg/dL — ABNORMAL HIGH (ref 65–99)
Osmolality: 289 (ref 275–301)
Potassium: 5 mmol/L (ref 3.5–5.1)

## 2012-12-08 NOTE — Telephone Encounter (Signed)
Pt states that she is doing well, but may have to reschedule her follow-up appt if daughter is unable to bring her. I informed pt that she needs to be seen this month for a hospital follow-up. She is also scheduled for a colonoscopy this month.  (records requested)

## 2012-12-08 NOTE — Telephone Encounter (Signed)
She already has a f/u with me on 12/27/12.  Needs hospital records to review.  Confirm with pt doing ok.  See if she has a f/u scheduled with cardiology or pulmonary.  If so, unless she feels uncomfortable - can keep 12/27/12 appt.  Let me know if a problem and will need to work something out.  Thanks.

## 2012-12-08 NOTE — Telephone Encounter (Signed)
Pt is needing 2 week [facility] f/u for shortness of breath. You don't have any openings within that time frame.

## 2012-12-09 LAB — CULTURE, BLOOD (SINGLE)

## 2012-12-27 ENCOUNTER — Ambulatory Visit (INDEPENDENT_AMBULATORY_CARE_PROVIDER_SITE_OTHER): Payer: Self-pay | Admitting: Internal Medicine

## 2012-12-27 ENCOUNTER — Encounter: Payer: Self-pay | Admitting: Internal Medicine

## 2012-12-27 VITALS — BP 130/60 | HR 94 | Temp 98.4°F | Ht 62.75 in | Wt 200.2 lb

## 2012-12-27 DIAGNOSIS — E039 Hypothyroidism, unspecified: Secondary | ICD-10-CM

## 2012-12-27 DIAGNOSIS — F3289 Other specified depressive episodes: Secondary | ICD-10-CM

## 2012-12-27 DIAGNOSIS — E785 Hyperlipidemia, unspecified: Secondary | ICD-10-CM

## 2012-12-27 DIAGNOSIS — M549 Dorsalgia, unspecified: Secondary | ICD-10-CM

## 2012-12-27 DIAGNOSIS — G4730 Sleep apnea, unspecified: Secondary | ICD-10-CM

## 2012-12-27 DIAGNOSIS — D649 Anemia, unspecified: Secondary | ICD-10-CM

## 2012-12-27 DIAGNOSIS — I10 Essential (primary) hypertension: Secondary | ICD-10-CM

## 2012-12-27 DIAGNOSIS — J4489 Other specified chronic obstructive pulmonary disease: Secondary | ICD-10-CM

## 2012-12-27 DIAGNOSIS — F329 Major depressive disorder, single episode, unspecified: Secondary | ICD-10-CM

## 2012-12-27 DIAGNOSIS — J449 Chronic obstructive pulmonary disease, unspecified: Secondary | ICD-10-CM

## 2012-12-27 DIAGNOSIS — G8929 Other chronic pain: Secondary | ICD-10-CM

## 2012-12-27 DIAGNOSIS — E119 Type 2 diabetes mellitus without complications: Secondary | ICD-10-CM

## 2012-12-27 LAB — FERRITIN: Ferritin: 43.2 ng/mL (ref 10.0–291.0)

## 2012-12-27 LAB — HEPATIC FUNCTION PANEL
ALT: 19 U/L (ref 0–35)
AST: 24 U/L (ref 0–37)
Albumin: 3.6 g/dL (ref 3.5–5.2)
Total Protein: 6.3 g/dL (ref 6.0–8.3)

## 2012-12-27 LAB — BASIC METABOLIC PANEL
Calcium: 9.8 mg/dL (ref 8.4–10.5)
Creatinine, Ser: 1.3 mg/dL — ABNORMAL HIGH (ref 0.4–1.2)
GFR: 42.08 mL/min — ABNORMAL LOW (ref >60.00)
Sodium: 140 mEq/L (ref 135–145)

## 2012-12-27 LAB — CBC WITH DIFFERENTIAL/PLATELET
Basophils Absolute: 0 10*3/uL (ref 0.0–0.1)
HCT: 30.1 % — ABNORMAL LOW (ref 36.0–46.0)
Lymphs Abs: 1.1 10*3/uL (ref 0.7–4.0)
MCV: 88 fl (ref 78.0–100.0)
Monocytes Absolute: 0.5 10*3/uL (ref 0.1–1.0)
Neutrophils Relative %: 65.2 % (ref 43.0–77.0)
Platelets: 238 10*3/uL (ref 150.0–400.0)
RDW: 16.1 % — ABNORMAL HIGH (ref 11.5–14.6)

## 2012-12-27 LAB — LIPID PANEL
Cholesterol: 222 mg/dL — ABNORMAL HIGH (ref 0–200)
Triglycerides: 137 mg/dL (ref 0.0–149.0)

## 2012-12-27 LAB — TSH: TSH: 4.39 u[IU]/mL (ref 0.35–5.50)

## 2012-12-27 LAB — HEMOGLOBIN A1C: Hgb A1c MFr Bld: 7.4 % — ABNORMAL HIGH (ref 4.6–6.5)

## 2012-12-28 ENCOUNTER — Other Ambulatory Visit: Payer: Self-pay | Admitting: Internal Medicine

## 2012-12-28 DIAGNOSIS — N289 Disorder of kidney and ureter, unspecified: Secondary | ICD-10-CM

## 2012-12-28 DIAGNOSIS — E875 Hyperkalemia: Secondary | ICD-10-CM

## 2012-12-28 NOTE — Progress Notes (Signed)
Order placed for f/u potassium check.  

## 2012-12-29 ENCOUNTER — Other Ambulatory Visit: Payer: Self-pay | Admitting: *Deleted

## 2012-12-29 ENCOUNTER — Ambulatory Visit: Payer: Self-pay | Admitting: Oncology

## 2012-12-29 MED ORDER — FLUTICASONE PROPIONATE 50 MCG/ACT NA SUSP: 2.0000 | Freq: Every day | NASAL | Status: AC

## 2012-12-30 ENCOUNTER — Encounter: Payer: Self-pay | Admitting: Internal Medicine

## 2012-12-30 DIAGNOSIS — G8929 Other chronic pain: Secondary | ICD-10-CM | POA: Insufficient documentation

## 2012-12-30 DIAGNOSIS — M549 Dorsalgia, unspecified: Secondary | ICD-10-CM | POA: Insufficient documentation

## 2012-12-30 NOTE — Assessment & Plan Note (Signed)
Continue CPAP.  

## 2012-12-30 NOTE — Assessment & Plan Note (Signed)
Breathing stable.

## 2012-12-30 NOTE — Assessment & Plan Note (Signed)
Seeing Dr Elesa Massed.  Currently stable.  Follow.  Doing better since someone is now living with her.

## 2012-12-30 NOTE — Progress Notes (Signed)
Subjective:    Patient ID: Elizabeth [name], female    DOB: [birth year], 79 y.o.   MRN: [id number]  HPI 79 year old female with past history of COPD on oxygen, diabetes, hypertension, hypercholesterolemia and hypothyroidism who comes in today for a hospital follow up.  States overall she is doing better. Husband died this summer.  She appears to be coping better.  Now has someone living with her.  She cooks for her and helps her with her ADLs.  She was recently hospitalized with sob.  This was felt to be related to a COPD exacerbation.  Had elevated troponin.  Evaluated by Dr Darrold Junker.  See his consultation note for details.  He felt the elevated troponin was related to demand - supply ischemia.  Did not feel further cardiac w/up was warranted then.  She did have an ECHO while in the hospital.  Revealed a normal EF.  Had significant anemia.  Was transfused.  Evaluated by GI.  Planning for further w/up as an outpatient.  Since her discharge she has done relatively well.  Energy improving.   More active around the house.  AM sugars averaging 130-160 and pm sugars 110-180.Marland Kitchen  No chest pain or tightness.  Breathing stable.  She was just recently diagnosed with breast cancer.  Seeing oncology.     Past Medical History  Diagnosis Date  . Diabetes mellitus     diabetic retinopathy  . Hypertension   . Hypercholesterolemia   . Hypothyroidism   . Depression   . History of colonic polyps   . COPD (chronic obstructive pulmonary disease)   . Anemia   . Sleep apnea   . Spinal stenosis   . Nephrolithiasis   . Macular degeneration     Current Outpatient Prescriptions on File Prior to Visit  Medication Sig Dispense Refill  . amLODipine (NORVASC) 2.5 MG tablet Take 1 tablet (2.5 mg total) by mouth daily.  90 tablet  3  . buPROPion (WELLBUTRIN) 100 MG tablet Take 100 mg by mouth 2 (two) times daily.      . Calcium Carb-Cholecalciferol (PRONUTRIENTS CALCIUM+D3 PO) Take by mouth.      . DOXEPIN HCL PO Take by  mouth.      . fenofibrate 160 MG tablet Take 1 tablet (160 mg total) by mouth daily.  90 tablet  1  . Fluticasone-Salmeterol (ADVAIR) 250-50 MCG/DOSE AEPB Inhale 1 puff into the lungs every 12 (twelve) hours.      . insulin aspart (NOVOLOG FLEXPEN) 100 UNIT/ML injection Inject into the skin 3 (three) times daily before meals. Inject 4 units at breakfast and 6 units at lunch and supper      . lisinopril (PRINIVIL,ZESTRIL) 40 MG tablet Take 1 tablet (40 mg total) by mouth daily.  90 tablet  3  . pantoprazole (PROTONIX) 40 MG tablet Take 1 tablet (40 mg total) by mouth daily.  90 tablet  1  . pioglitazone (ACTOS) 45 MG tablet Take 45 mg by mouth daily.      Marland Kitchen tiotropium (SPIRIVA) 18 MCG inhalation capsule Place 18 mcg into inhaler and inhale at bedtime.      Marland Kitchen venlafaxine XR (EFFEXOR-XR) 75 MG 24 hr capsule Take 75 mg by mouth daily.      Marland Kitchen albuterol-ipratropium (COMBIVENT) 18-103 MCG/ACT inhaler Inhale 2 puffs into the lungs 2 (two) times daily.      Marland Kitchen gabapentin (NEURONTIN) 100 MG capsule Take 100 mg by mouth daily.      . Glucose Blood (  BAYER BREEZE 2 TEST VI) 1 strip by In Vitro route 2 (two) times daily. Dx code 250.01      . HYDROcodone-acetaminophen (NORCO/VICODIN) 5-325 MG per tablet Take 1 tablet by mouth every 4 (four) hours as needed.      . Insulin Pen Needle (B-D UF III MINI PEN NEEDLES) 31G X 5 MM MISC 1 each by Does not apply route 3 (three) times daily.      Marland Kitchen levothyroxine (LEVOTHROID) 25 MCG tablet Take 1 tablet (25 mcg total) by mouth daily.  90 tablet  3  . nystatin (MYCOSTATIN) 100000 UNIT/ML suspension Take 4 mLs by mouth 4 (four) times daily.      . Psyllium (METAMUCIL PO) Take by mouth.       No current facility-administered medications on file prior to visit.    Review of Systems Patient denies any headache, lightheadedness or dizziness.  No sinus or allergy symptoms.  No chest pain, tightness or palpitations.  No increased shortness of breath, cough or congestion.  No  nausea or vomiting.  No acid reflux.  No abdominal pain or cramping.  No bowel change, such as diarrhea, constipation, BRBPR or melana.  No urine change.  Overall she feels she is doing relatively well.  Still some back pain.  Ice and tylenol help.  Planning to follow up with Dr Meredeth Ide and Dr Marva Panda next week.  Seeing Dr Fransico Michael for her macular degeneration.       Objective:   Physical Exam  Filed Vitals:   12/27/12 1033  BP: 130/60  Pulse: 94  Temp: 98.4 F (18.68 C)   79 year old female in no acute distress.   HEENT:  Nares - clear.  OP- without lesions or erythema.  NECK:  Supple, nontender.  No audible bruit.   HEART:  Appears to be regular. LUNGS:  Without crackles or wheezing audible.  Respirations even and unlabored.   RADIAL PULSE:  Equal bilaterally.  ABDOMEN:  Soft, nontender.  No audible abdominal bruit.   EXTREMITIES:  No increased edema to be present.                     Assessment & Plan:  CARDIOVASCULAR.   Currently stable.  Continues to follow up with cardiology.     GI.  Colonoscopy 07/28/10 with tubular adenomatous polyps in the cecum.  Had recommended follow up colonoscopy in three years.  With recent significant anemia.  Evaluated by GI in the hospital.  Due to see Dr Marva Panda next week.  No bleeding noted per her report.      ABNORMAL MAMMOGRAM.  Had a mammogram 2/13 - BiRADS IV.  Follow up mammogram 8/13.  Seeing Dr Katrinka Blazing.  Biopsy - infiltrating mammary carcinoma of the right breast.  S/p surgery.  Continues to follow up with oncology.    HEALTH MAINTENANCE.  Physical 06/24/11.  She is s/p hysterectomy.  Colonoscopy as outlined.  Mammogram 2/13 - BiRADS IV.  Referred to Dr Katrinka Blazing.  Had a follow up mammogram 8/13.  See above.

## 2012-12-30 NOTE — Assessment & Plan Note (Signed)
Had been seeing Dr Miller.  Up to date with eye exams.  Last urine microalbumin/cr ratio wnl.  Follow met b and a1c. Sugars doing well - (per her report).  Follow.    

## 2012-12-30 NOTE — Assessment & Plan Note (Signed)
Back pain persistent.  Ice and tylenol helping.  Follow.

## 2012-12-30 NOTE — Assessment & Plan Note (Signed)
Low cholesterol diet and exercise.  She is off the Zetia.  Check lipid panel.

## 2012-12-30 NOTE — Assessment & Plan Note (Signed)
Blood pressure under good control.  Same meds.  Check metabolic panel with next labs.

## 2012-12-30 NOTE — Assessment & Plan Note (Signed)
On thyroid replacement.  Follow tsh.  

## 2012-12-30 NOTE — Assessment & Plan Note (Signed)
Has seen GI.  Previous SIEP - negative M spike.  Is being followed at the [facility].  Significant anemia in the hospital.  Required transfusion.  Planning to follow up with GI next week.  She reports no active bleeding.  Recheck cbc today.

## 2013-01-03 ENCOUNTER — Other Ambulatory Visit (INDEPENDENT_AMBULATORY_CARE_PROVIDER_SITE_OTHER): Payer: Medicare Other

## 2013-01-03 DIAGNOSIS — N289 Disorder of kidney and ureter, unspecified: Secondary | ICD-10-CM

## 2013-01-03 DIAGNOSIS — E875 Hyperkalemia: Secondary | ICD-10-CM

## 2013-01-04 LAB — BASIC METABOLIC PANEL
BUN: 29 mg/dL — ABNORMAL HIGH (ref 6–23)
Chloride: 101 mEq/L (ref 96–112)
Creatinine, Ser: 1.4 mg/dL — ABNORMAL HIGH (ref 0.4–1.2)
GFR: 36.73 mL/min — ABNORMAL LOW (ref >60.00)
Potassium: 4.4 mEq/L (ref 3.5–5.1)

## 2013-01-05 ENCOUNTER — Other Ambulatory Visit: Payer: Self-pay | Admitting: Internal Medicine

## 2013-01-05 ENCOUNTER — Telehealth: Payer: Self-pay | Admitting: Internal Medicine

## 2013-01-05 DIAGNOSIS — N289 Disorder of kidney and ureter, unspecified: Secondary | ICD-10-CM

## 2013-01-05 NOTE — Telephone Encounter (Signed)
Daughter notified & will hold off on checking her Hemoglobin for now

## 2013-01-05 NOTE — Telephone Encounter (Signed)
Patient's daughter wanting results today from her mother's lab work done on 8.6.14

## 2013-01-05 NOTE — Telephone Encounter (Signed)
Daughter notified of lab results & scheduled a lab appt. Wants to know why her Hemoglobin was not checked? She stated that it was so low in the hospital, she had to be given a pint of blood.  Would like to have it checked when she comes back for repeat labs.

## 2013-01-05 NOTE — Telephone Encounter (Signed)
Please notify her daughter that this last lab was a repeat lab from the previous week.  Her hemoglobin was checked on 7/30 as a hospital follow up hgb.  It was 9.8 then (which was improved from her check in the hospital).  I did not see the need to recheck it again this week (too soon).  Does she still want it rechecked with this next labs.  i was planning to follow up with this, but on down the road.

## 2013-01-05 NOTE — Telephone Encounter (Signed)
Kidney function is relatively stable from the last check, but decreased.  Needs to stay hydrated.  Will need to follow closely.  Recommend recheck of her kidney function in 7-10 days.

## 2013-01-05 NOTE — Progress Notes (Signed)
Order placed for f/u met b and urinalysis.   

## 2013-01-09 ENCOUNTER — Encounter: Payer: Self-pay | Admitting: Internal Medicine

## 2013-01-12 ENCOUNTER — Other Ambulatory Visit (INDEPENDENT_AMBULATORY_CARE_PROVIDER_SITE_OTHER): Payer: Medicare Other

## 2013-01-12 DIAGNOSIS — N289 Disorder of kidney and ureter, unspecified: Secondary | ICD-10-CM

## 2013-01-12 LAB — BASIC METABOLIC PANEL
BUN: 31 mg/dL — ABNORMAL HIGH (ref 6–23)
Calcium: 9.4 mg/dL (ref 8.4–10.5)
Creatinine, Ser: 1.2 mg/dL (ref 0.4–1.2)
GFR: 46.22 mL/min — ABNORMAL LOW (ref >60.00)
Glucose, Bld: 93 mg/dL (ref 70–99)
Sodium: 137 mEq/L (ref 135–145)

## 2013-01-15 ENCOUNTER — Encounter: Payer: Self-pay | Admitting: *Deleted

## 2013-01-15 LAB — URINALYSIS, ROUTINE W REFLEX MICROSCOPIC
Bilirubin Urine: NEGATIVE
Ketones, ur: NEGATIVE
Urobilinogen, UA: 0.2 (ref 0.0–1.0)

## 2013-01-19 ENCOUNTER — Other Ambulatory Visit: Payer: Self-pay | Admitting: *Deleted

## 2013-01-19 MED ORDER — PANTOPRAZOLE SODIUM 40 MG PO TBEC: 40.0000 mg | DELAYED_RELEASE_TABLET | Freq: Every day | ORAL | Status: DC

## 2013-02-26 ENCOUNTER — Other Ambulatory Visit: Payer: Self-pay | Admitting: *Deleted

## 2013-02-26 MED ORDER — FENOFIBRATE 160 MG PO TABS: 160.0000 mg | ORAL_TABLET | Freq: Every day | ORAL | Status: DC

## 2013-03-01 ENCOUNTER — Other Ambulatory Visit: Payer: Self-pay | Admitting: *Deleted

## 2013-03-01 MED ORDER — GLUCOSE BLOOD VI DISK: DISK | Status: DC

## 2013-03-04 ENCOUNTER — Ambulatory Visit: Payer: Self-pay | Admitting: Internal Medicine

## 2013-03-04 LAB — URINALYSIS, COMPLETE
Bilirubin,UR: NEGATIVE
Glucose,UR: NEGATIVE mg/dL (ref 0–75)
Ph: 5 (ref 4.5–8.0)
Protein: NEGATIVE

## 2013-03-06 ENCOUNTER — Telehealth: Payer: Self-pay | Admitting: Internal Medicine

## 2013-03-06 LAB — URINE CULTURE

## 2013-03-06 NOTE — Telephone Encounter (Signed)
Prednisone can increase sugar.  Sometimes prednisone is needed to help with breathing and we have to adjust insulin accordingly.  If she is still having issues and has questions about the prednisone, she probably needs to be seen.  She sees pulmonary (Dr Meredeth Ide) - can call his office or schedule an appt here.  If acute symptoms, then rec acute care today.

## 2013-03-06 NOTE — Telephone Encounter (Signed)
Please advise 

## 2013-03-06 NOTE — Telephone Encounter (Signed)
Pt states she was seen at urgent care Sunday (10/5) and diagnosed with a yeast infection and tightness in chest with hoarseness.  Pt was prescribed 3 day cream and prednisone 10 mg.  Pt states her blood sugars went up really high and she had to take 3 doses of insulin to get the blood sugar down.  Pt did not take 2 of the doses for yesterday but did take it again this morning.  Pt asking for phone call asap because she is home alone and does not know what to do about the prednisone.  Pt is still not feeling well and has hoarseness.  Would like to discuss this with the nurse or Dr. Lorin Picket asap.

## 2013-03-06 NOTE — Telephone Encounter (Signed)
Pt.notified

## 2013-03-08 ENCOUNTER — Telehealth: Payer: Self-pay | Admitting: Internal Medicine

## 2013-03-08 MED ORDER — GABAPENTIN 100 MG PO CAPS: 100.0000 mg | ORAL_CAPSULE | Freq: Every day | ORAL | Status: DC

## 2013-03-08 NOTE — Telephone Encounter (Signed)
Gabapentin 100 mg #90 day supply x 1 year to [facility].  Previously prescribed by Dr. Hyacinth Meeker.

## 2013-03-08 NOTE — Telephone Encounter (Signed)
Okay to refill? Pt last seen on: 7/30. Next appt: 10/31

## 2013-03-08 NOTE — Telephone Encounter (Signed)
I am ok to refill gabapentin #90 with no refills.  Can give refill when she f/u at her appt

## 2013-03-08 NOTE — Telephone Encounter (Signed)
Pt notified & Rx sent to [facility]

## 2013-03-23 ENCOUNTER — Ambulatory Visit (INDEPENDENT_AMBULATORY_CARE_PROVIDER_SITE_OTHER): Payer: Medicare Other

## 2013-03-23 DIAGNOSIS — Z23 Encounter for immunization: Secondary | ICD-10-CM

## 2013-03-30 ENCOUNTER — Encounter: Payer: Medicare Other | Admitting: Internal Medicine

## 2013-05-04 ENCOUNTER — Other Ambulatory Visit: Payer: Self-pay | Admitting: *Deleted

## 2013-05-04 MED ORDER — GABAPENTIN 100 MG PO CAPS: 100.0000 mg | ORAL_CAPSULE | Freq: Every day | ORAL | Status: DC

## 2013-05-15 ENCOUNTER — Encounter: Payer: Self-pay | Admitting: Internal Medicine

## 2013-05-15 ENCOUNTER — Ambulatory Visit (INDEPENDENT_AMBULATORY_CARE_PROVIDER_SITE_OTHER): Payer: Medicare Other | Admitting: Internal Medicine

## 2013-05-15 ENCOUNTER — Other Ambulatory Visit: Payer: Self-pay | Admitting: Internal Medicine

## 2013-05-15 ENCOUNTER — Encounter (INDEPENDENT_AMBULATORY_CARE_PROVIDER_SITE_OTHER): Payer: Self-pay

## 2013-05-15 VITALS — BP 124/58 | HR 83 | Temp 98.7°F | Resp 12 | Ht 64.25 in | Wt 205.0 lb

## 2013-05-15 DIAGNOSIS — I10 Essential (primary) hypertension: Secondary | ICD-10-CM

## 2013-05-15 DIAGNOSIS — F329 Major depressive disorder, single episode, unspecified: Secondary | ICD-10-CM

## 2013-05-15 DIAGNOSIS — J449 Chronic obstructive pulmonary disease, unspecified: Secondary | ICD-10-CM

## 2013-05-15 DIAGNOSIS — M549 Dorsalgia, unspecified: Secondary | ICD-10-CM

## 2013-05-15 DIAGNOSIS — E119 Type 2 diabetes mellitus without complications: Secondary | ICD-10-CM

## 2013-05-15 DIAGNOSIS — E785 Hyperlipidemia, unspecified: Secondary | ICD-10-CM

## 2013-05-15 DIAGNOSIS — G8929 Other chronic pain: Secondary | ICD-10-CM

## 2013-05-15 DIAGNOSIS — E039 Hypothyroidism, unspecified: Secondary | ICD-10-CM

## 2013-05-15 DIAGNOSIS — G4730 Sleep apnea, unspecified: Secondary | ICD-10-CM

## 2013-05-15 DIAGNOSIS — D649 Anemia, unspecified: Secondary | ICD-10-CM

## 2013-05-15 DIAGNOSIS — R002 Palpitations: Secondary | ICD-10-CM

## 2013-05-15 LAB — HEPATIC FUNCTION PANEL
AST: 20 U/L (ref 0–37)
Albumin: 3.8 g/dL (ref 3.5–5.2)
Bilirubin, Direct: 0.1 mg/dL (ref 0.0–0.3)
Total Bilirubin: 0.3 mg/dL (ref 0.3–1.2)
Total Protein: 6.1 g/dL (ref 6.0–8.3)

## 2013-05-15 LAB — BASIC METABOLIC PANEL
BUN: 41 mg/dL — ABNORMAL HIGH (ref 6–23)
Chloride: 104 mEq/L (ref 96–112)
GFR: 46.18 mL/min — ABNORMAL LOW (ref >60.00)
Glucose, Bld: 106 mg/dL — ABNORMAL HIGH (ref 70–99)
Potassium: 4.9 mEq/L (ref 3.5–5.1)

## 2013-05-15 LAB — HEMOGLOBIN A1C: Hgb A1c MFr Bld: 6.6 % — ABNORMAL HIGH (ref 4.6–6.5)

## 2013-05-15 LAB — LIPID PANEL
Cholesterol: 220 mg/dL — ABNORMAL HIGH (ref 0–200)
HDL: 119.7 mg/dL (ref >39.00)
VLDL: 12.8 mg/dL (ref 0.0–40.0)

## 2013-05-15 NOTE — Progress Notes (Signed)
Order placed for labs.

## 2013-05-15 NOTE — Progress Notes (Signed)
Pre visit review using our clinic review tool, if applicable. No additional management support is needed unless otherwise documented below in the visit note. 

## 2013-05-16 ENCOUNTER — Other Ambulatory Visit: Payer: Self-pay | Admitting: Internal Medicine

## 2013-05-16 DIAGNOSIS — N289 Disorder of kidney and ureter, unspecified: Secondary | ICD-10-CM

## 2013-05-16 LAB — LDL CHOLESTEROL, DIRECT: Direct LDL: 55.8 mg/dL

## 2013-05-16 NOTE — Progress Notes (Signed)
Order placed for f/u labs.  

## 2013-05-19 ENCOUNTER — Encounter: Payer: Self-pay | Admitting: Internal Medicine

## 2013-05-19 NOTE — Progress Notes (Signed)
Subjective:    Patient ID: Elizabeth [name], female    DOB: Nov 23, 1925, 79 y.o.   MRN: [id number]  HPI 79 year old female with past history of COPD on oxygen, diabetes, hypertension, hypercholesterolemia and hypothyroidism who comes in today to follow up on these issues as well as for a complete physical exam.  States overall she is doing better. Husband died this summer.  She appears to be coping well.   Now has someone living with her.  She cooks for her and helps her with her ADLs.  Energy improving.   More active around the house.  AM sugars averaging <120 and pm sugars 120-130.   No chest pain or tightness.  Breathing stable.  She was just recently diagnosed with breast cancer.  Seeing oncology.  Currently doing well.   Accompanied by her daughter.  History obtained by both of them.  Daughter feels Ms Hiraldo is doing well.     Past Medical History  Diagnosis Date  . Diabetes mellitus     diabetic retinopathy  . Hypertension   . Hypercholesterolemia   . Hypothyroidism   . Depression   . History of colonic polyps   . COPD (chronic obstructive pulmonary disease)   . Anemia   . Sleep apnea   . Spinal stenosis   . Nephrolithiasis   . Macular degeneration     Current Outpatient Prescriptions on File Prior to Visit  Medication Sig Dispense Refill  . acetaminophen (TYLENOL) 650 MG CR tablet Take 1,300 mg by mouth every 8 (eight) hours as needed for pain.      Marland Kitchen albuterol-ipratropium (COMBIVENT) 18-103 MCG/ACT inhaler Inhale 2 puffs into the lungs 2 (two) times daily.      Marland Kitchen amLODipine (NORVASC) 2.5 MG tablet Take 1 tablet (2.5 mg total) by mouth daily.  90 tablet  3  . buPROPion (WELLBUTRIN) 100 MG tablet Take 100 mg by mouth 2 (two) times daily.      . Calcium Carb-Cholecalciferol (PRONUTRIENTS CALCIUM+D3 PO) Take by mouth.      . DOXEPIN HCL PO Take by mouth.      . fenofibrate 160 MG tablet Take 1 tablet (160 mg total) by mouth daily.  90 tablet  1  . fluticasone (FLONASE) 50 MCG/ACT  nasal spray Place 2 sprays into the nose daily.  48 g  2  . Fluticasone-Salmeterol (ADVAIR) 250-50 MCG/DOSE AEPB Inhale 1 puff into the lungs every 12 (twelve) hours.      . gabapentin (NEURONTIN) 100 MG capsule Take 1 capsule (100 mg total) by mouth daily.  90 capsule  0  . Glucose Blood (BAYER BREEZE 2 TEST) DISK Check blood sugars 4 times daily  150 each  5  . HYDROcodone-acetaminophen (NORCO/VICODIN) 5-325 MG per tablet Take 1 tablet by mouth every 4 (four) hours as needed.      . insulin aspart (NOVOLOG FLEXPEN) 100 UNIT/ML injection Inject into the skin 3 (three) times daily before meals. Inject 4 units at breakfast and 6 units at lunch and supper      . Insulin Pen Needle (B-D UF III MINI PEN NEEDLES) 31G X 5 MM MISC 1 each by Does not apply route 3 (three) times daily.      Marland Kitchen levothyroxine (LEVOTHROID) 25 MCG tablet Take 1 tablet (25 mcg total) by mouth daily.  90 tablet  3  . lisinopril (PRINIVIL,ZESTRIL) 40 MG tablet Take 1 tablet (40 mg total) by mouth daily.  90 tablet  3  . nystatin (  MYCOSTATIN) 100000 UNIT/ML suspension Take 4 mLs by mouth 4 (four) times daily.      . pantoprazole (PROTONIX) 40 MG tablet Take 1 tablet (40 mg total) by mouth daily.  90 tablet  1  . pioglitazone (ACTOS) 45 MG tablet Take 45 mg by mouth daily.      . Psyllium (METAMUCIL PO) Take by mouth.      . tiotropium (SPIRIVA) 18 MCG inhalation capsule Place 18 mcg into inhaler and inhale at bedtime.      Marland Kitchen venlafaxine XR (EFFEXOR-XR) 75 MG 24 hr capsule Take 75 mg by mouth daily.       No current facility-administered medications on file prior to visit.    Review of Systems Patient denies any headache, lightheadedness or dizziness.  No sinus or allergy symptoms.  No chest pain, tightness or palpitations.  No increased shortness of breath, cough or congestion.  No nausea or vomiting.  No acid reflux.  No abdominal pain or cramping.  No bowel change, such as diarrhea, constipation, BRBPR or melana.  No urine  change.  Overall she feels she is doing relatively well.   Seeing Dr Fransico Michael for her macular degeneration.  Sugars as outlined.       Objective:   Physical Exam  Filed Vitals:   05/15/13 1136  BP: 124/58  Pulse: 83  Temp: 98.7 F (37.1 C)  Resp: 45   79 year old female in no acute distress.   HEENT:  Nares- clear.  Oropharynx - without lesions. NECK:  Supple.  Nontender.  No audible bruit.  HEART:  Appears to be regular. LUNGS:  No crackles or wheezing audible.  Respirations even and unlabored.  RADIAL PULSE:  Equal bilaterally.    BREASTS:  No nipple discharge or nipple retraction present.  Could not appreciate any distinct nodules or axillary adenopathy.  ABDOMEN:  Soft, nontender.  Bowel sounds present and normal.  No audible abdominal bruit.  GU:  Not performed.    EXTREMITIES:  No increased edema present.  DP pulses palpable and equal bilaterally.      FEET:  Without lesions.       Assessment & Plan:  CARDIOVASCULAR.   Currently stable.  Continues to follow up with cardiology.     GI.  Colonoscopy 07/28/10 with tubular adenomatous polyps in the cecum.  Had recommended follow up colonoscopy in three years.  With recent significant anemia.  Evaluated by GI in the hospital.  They had recommended a f/u colonoscopy as an outpatient.  She declines.  Desires not to pursue colonoscopy.   No bleeding noted per her report.  Follow.   ABNORMAL MAMMOGRAM.  Had a mammogram 2/13 - BiRADS IV.  Follow up mammogram 8/13.  Seeing Dr Katrinka Blazing.  Biopsy - infiltrating mammary carcinoma of the right breast.  S/p surgery.  Continues to follow up with oncology.    HEALTH MAINTENANCE.  Physical today.  She is s/p hysterectomy.  Colonoscopy as outlined.  Mammograms followed by oncology.

## 2013-05-19 NOTE — Assessment & Plan Note (Signed)
On thyroid replacement.  Follow tsh.  

## 2013-05-19 NOTE — Assessment & Plan Note (Signed)
Back pain persistent.  Ice and tylenol helping.  Follow.

## 2013-05-19 NOTE — Assessment & Plan Note (Signed)
Blood pressure under good control.  Same meds.  Check metabolic panel with next labs.

## 2013-05-19 NOTE — Assessment & Plan Note (Signed)
Not an issue for her now.  Follow.  

## 2013-05-19 NOTE — Assessment & Plan Note (Signed)
Breathing stable.

## 2013-05-19 NOTE — Assessment & Plan Note (Signed)
Seeing Dr Elesa Massed.  Currently stable.  Follow.  Doing better since someone is now living with her.

## 2013-05-19 NOTE — Assessment & Plan Note (Signed)
Continue CPAP.  

## 2013-05-19 NOTE — Assessment & Plan Note (Signed)
Low cholesterol diet and exercise.  She is off the Zetia.  Check lipid panel.

## 2013-05-19 NOTE — Assessment & Plan Note (Signed)
Had been seeing Dr Miller.  Up to date with eye exams.  Last urine microalbumin/cr ratio wnl.  Follow met b and a1c. Sugars doing well - (per her report).  Follow.    

## 2013-05-19 NOTE — Assessment & Plan Note (Signed)
Has seen GI.  Previous SIEP - negative M spike.  Is being followed at the [facility].  Significant anemia in the hospital.  Required transfusion.  She reports no active bleeding since discharge.  Daughter reports Dr Meredeth Ide just checked iron.  Obtain results.  Discussed out patient colonoscopy.  She declines.  Desires no further w/up at this point.

## 2013-05-23 ENCOUNTER — Other Ambulatory Visit: Payer: Self-pay | Admitting: Internal Medicine

## 2013-05-23 DIAGNOSIS — Z1211 Encounter for screening for malignant neoplasm of colon: Secondary | ICD-10-CM

## 2013-05-23 NOTE — Progress Notes (Signed)
Order placed for IFOB 

## 2013-06-06 ENCOUNTER — Telehealth: Payer: Self-pay | Admitting: Internal Medicine

## 2013-06-06 ENCOUNTER — Other Ambulatory Visit: Payer: Self-pay | Admitting: Internal Medicine

## 2013-06-06 ENCOUNTER — Other Ambulatory Visit (INDEPENDENT_AMBULATORY_CARE_PROVIDER_SITE_OTHER): Payer: Medicare Other

## 2013-06-06 DIAGNOSIS — N289 Disorder of kidney and ureter, unspecified: Secondary | ICD-10-CM

## 2013-06-06 LAB — BASIC METABOLIC PANEL
BUN: 37 mg/dL — AB (ref 6–23)
CO2: 34 meq/L — AB (ref 19–32)
CREATININE: 1.5 mg/dL — AB (ref 0.4–1.2)
Calcium: 9 mg/dL (ref 8.4–10.5)
Chloride: 97 mEq/L (ref 96–112)
GFR: 34.74 mL/min — ABNORMAL LOW (ref >60.00)
GLUCOSE: 165 mg/dL — AB (ref 70–99)
Potassium: 4.6 mEq/L (ref 3.5–5.1)
Sodium: 139 mEq/L (ref 135–145)

## 2013-06-06 NOTE — Telephone Encounter (Signed)
In office today, new prescriptions to [facility] with [employer].  Card scanned.  States all scripts were refilled before the end of 2014 so will be a while before scripts are needed, but when they are needed new scripts will have to be sent to [facility].

## 2013-06-06 NOTE — Telephone Encounter (Signed)
Pt can not find Pharmacy in database

## 2013-06-06 NOTE — Progress Notes (Signed)
Orders placed for f/u labs.  

## 2013-06-14 ENCOUNTER — Telehealth: Payer: Self-pay | Admitting: Internal Medicine

## 2013-06-14 ENCOUNTER — Encounter: Payer: Self-pay | Admitting: Internal Medicine

## 2013-06-14 ENCOUNTER — Other Ambulatory Visit: Payer: Medicare Other

## 2013-06-14 ENCOUNTER — Other Ambulatory Visit (INDEPENDENT_AMBULATORY_CARE_PROVIDER_SITE_OTHER): Payer: Medicare Other

## 2013-06-14 DIAGNOSIS — Z23 Encounter for immunization: Secondary | ICD-10-CM

## 2013-06-14 DIAGNOSIS — N289 Disorder of kidney and ureter, unspecified: Secondary | ICD-10-CM

## 2013-06-14 LAB — URINALYSIS, ROUTINE W REFLEX MICROSCOPIC
BILIRUBIN URINE: NEGATIVE
Hgb urine dipstick: NEGATIVE
Ketones, ur: NEGATIVE
NITRITE: NEGATIVE
Specific Gravity, Urine: 1.01 (ref 1.000–1.030)
Total Protein, Urine: NEGATIVE
Urine Glucose: NEGATIVE
Urobilinogen, UA: 0.2 (ref 0.0–1.0)
pH: 5.5 (ref 5.0–8.0)

## 2013-06-14 LAB — BASIC METABOLIC PANEL
BUN: 40 mg/dL — AB (ref 6–23)
CALCIUM: 9.3 mg/dL (ref 8.4–10.5)
CO2: 33 mEq/L — ABNORMAL HIGH (ref 19–32)
Chloride: 99 mEq/L (ref 96–112)
Creatinine, Ser: 1.5 mg/dL — ABNORMAL HIGH (ref 0.4–1.2)
GFR: 36.4 mL/min — AB (ref >60.00)
Glucose, Bld: 142 mg/dL — ABNORMAL HIGH (ref 70–99)
Potassium: 4.7 mEq/L (ref 3.5–5.1)
Sodium: 137 mEq/L (ref 135–145)

## 2013-06-14 NOTE — Telephone Encounter (Signed)
Pt came in today for labs and wanted to know if she could get her pneumonia shot

## 2013-06-14 NOTE — Addendum Note (Signed)
Addended by: Warden FillersWRIGHT, LATOYA S on: 06/14/2013 02:04 PM   Modules accepted: Orders

## 2013-06-14 NOTE — Telephone Encounter (Signed)
Pt states she hasn't had a Pnuemonia shot in over 10 years. Prevnar given to pt while in lab today

## 2013-06-14 NOTE — Telephone Encounter (Signed)
Requested immunization records from [facility] 

## 2013-06-15 ENCOUNTER — Telehealth: Payer: Self-pay | Admitting: Internal Medicine

## 2013-06-15 DIAGNOSIS — N289 Disorder of kidney and ureter, unspecified: Secondary | ICD-10-CM

## 2013-06-15 NOTE — Telephone Encounter (Signed)
Order placed for nephrology referral.   °

## 2013-06-24 ENCOUNTER — Inpatient Hospital Stay: Payer: Self-pay | Admitting: Internal Medicine

## 2013-06-24 LAB — BASIC METABOLIC PANEL
Anion Gap: 5 — ABNORMAL LOW (ref 7–16)
BUN: 31 mg/dL — ABNORMAL HIGH (ref 7–18)
CALCIUM: 8.8 mg/dL (ref 8.5–10.1)
CREATININE: 1.55 mg/dL — AB (ref 0.60–1.30)
Chloride: 95 mmol/L — ABNORMAL LOW (ref 98–107)
Co2: 31 mmol/L (ref 21–32)
EGFR (African American): 35 — ABNORMAL LOW
EGFR (Non-African Amer.): 30 — ABNORMAL LOW
Glucose: 119 mg/dL — ABNORMAL HIGH (ref 65–99)
OSMOLALITY: 270 (ref 275–301)
Potassium: 4.3 mmol/L (ref 3.5–5.1)
Sodium: 131 mmol/L — ABNORMAL LOW (ref 136–145)

## 2013-06-24 LAB — CBC
HCT: 29.1 % — ABNORMAL LOW (ref 35.0–47.0)
HGB: 9.1 g/dL — ABNORMAL LOW (ref 12.0–16.0)
MCH: 28.7 pg (ref 26.0–34.0)
MCHC: 31.4 g/dL — ABNORMAL LOW (ref 32.0–36.0)
MCV: 91 fL (ref 80–100)
Platelet: 293 10*3/uL (ref 150–440)
RBC: 3.18 10*6/uL — AB (ref 3.80–5.20)
RDW: 16.2 % — ABNORMAL HIGH (ref 11.5–14.5)
WBC: 13.3 10*3/uL — ABNORMAL HIGH (ref 3.6–11.0)

## 2013-06-24 LAB — TROPONIN I: TROPONIN-I: 0.15 ng/mL — AB

## 2013-06-25 LAB — BASIC METABOLIC PANEL
Anion Gap: 3 — ABNORMAL LOW (ref 7–16)
BUN: 31 mg/dL — ABNORMAL HIGH (ref 7–18)
CALCIUM: 8.7 mg/dL (ref 8.5–10.1)
Chloride: 96 mmol/L — ABNORMAL LOW (ref 98–107)
Co2: 31 mmol/L (ref 21–32)
Creatinine: 1.38 mg/dL — ABNORMAL HIGH (ref 0.60–1.30)
EGFR (African American): 40 — ABNORMAL LOW
EGFR (Non-African Amer.): 35 — ABNORMAL LOW
Glucose: 160 mg/dL — ABNORMAL HIGH (ref 65–99)
Osmolality: 271 (ref 275–301)
POTASSIUM: 4.5 mmol/L (ref 3.5–5.1)
Sodium: 130 mmol/L — ABNORMAL LOW (ref 136–145)

## 2013-06-25 LAB — CK TOTAL AND CKMB (NOT AT ARMC)
CK, TOTAL: 123 U/L (ref 21–215)
CK, Total: 113 U/L (ref 21–215)
CK, Total: 115 U/L (ref 21–215)
CK-MB: 1 ng/mL (ref 0.5–3.6)
CK-MB: 1.1 ng/mL (ref 0.5–3.6)
CK-MB: 1 ng/mL (ref 0.5–3.6)

## 2013-06-25 LAB — CBC WITH DIFFERENTIAL/PLATELET
BASOS PCT: 0.1 %
Basophil #: 0 10*3/uL (ref 0.0–0.1)
Eosinophil #: 0 10*3/uL (ref 0.0–0.7)
Eosinophil %: 0 %
HCT: 25.8 % — ABNORMAL LOW (ref 35.0–47.0)
HGB: 8.3 g/dL — AB (ref 12.0–16.0)
Lymphocyte #: 0.3 10*3/uL — ABNORMAL LOW (ref 1.0–3.6)
Lymphocyte %: 2.2 %
MCH: 29.3 pg (ref 26.0–34.0)
MCHC: 32.3 g/dL (ref 32.0–36.0)
MCV: 91 fL (ref 80–100)
Monocyte #: 0.1 x10 3/mm — ABNORMAL LOW (ref 0.2–0.9)
Monocyte %: 0.9 %
NEUTROS ABS: 11.7 10*3/uL — AB (ref 1.4–6.5)
Neutrophil %: 96.8 %
Platelet: 241 10*3/uL (ref 150–440)
RBC: 2.85 10*6/uL — AB (ref 3.80–5.20)
RDW: 15.8 % — ABNORMAL HIGH (ref 11.5–14.5)
WBC: 12.1 10*3/uL — ABNORMAL HIGH (ref 3.6–11.0)

## 2013-06-25 LAB — TROPONIN I
TROPONIN-I: 0.09 ng/mL — AB
Troponin-I: 0.08 ng/mL — ABNORMAL HIGH

## 2013-06-26 LAB — BASIC METABOLIC PANEL
Anion Gap: 5 — ABNORMAL LOW (ref 7–16)
BUN: 45 mg/dL — ABNORMAL HIGH (ref 7–18)
CALCIUM: 9.1 mg/dL (ref 8.5–10.1)
Chloride: 96 mmol/L — ABNORMAL LOW (ref 98–107)
Co2: 31 mmol/L (ref 21–32)
Creatinine: 1.4 mg/dL — ABNORMAL HIGH (ref 0.60–1.30)
EGFR (African American): 40 — ABNORMAL LOW
EGFR (Non-African Amer.): 34 — ABNORMAL LOW
Glucose: 166 mg/dL — ABNORMAL HIGH (ref 65–99)
Osmolality: 280 (ref 275–301)
Potassium: 4.4 mmol/L (ref 3.5–5.1)
Sodium: 132 mmol/L — ABNORMAL LOW (ref 136–145)

## 2013-06-27 ENCOUNTER — Telehealth: Payer: Self-pay | Admitting: *Deleted

## 2013-06-27 NOTE — Telephone Encounter (Signed)
Records requested

## 2013-06-27 NOTE — Telephone Encounter (Signed)
Need records and need to know reason for need for skilled nursing and PT.  Then can get order for referral.

## 2013-06-27 NOTE — Telephone Encounter (Signed)
Pt was seen @ [facility] ER today & they are requesting skilled nursing & PT. If you are agreeable, a verbal order is needed

## 2013-06-28 NOTE — Telephone Encounter (Signed)
Records in your folder. Gavin PoundDeborah has called back to check on the status us the verbal order. Pt has been discharged & they were hoping to get out to her before the weekend.

## 2013-06-28 NOTE — Telephone Encounter (Signed)
Verbal order given  

## 2013-06-28 NOTE — Telephone Encounter (Signed)
Ok for nursing and PT.  Pt recently admitted for respiratory distress, fall and fatigue

## 2013-06-29 ENCOUNTER — Other Ambulatory Visit: Payer: Self-pay | Admitting: *Deleted

## 2013-06-29 LAB — CULTURE, BLOOD (SINGLE)

## 2013-06-29 MED ORDER — NYSTATIN 100000 UNIT/ML MT SUSP: OROMUCOSAL | Status: AC

## 2013-06-29 NOTE — Telephone Encounter (Signed)
Phoned in to pharmacy & Jasmine DecemberSharon was notified

## 2013-06-29 NOTE — Telephone Encounter (Signed)
Pt has broken out due to the inhalers that she was put on in the hospital (Did not rinse her mouth as instructed) & would like to get started on the Nystatin mouthwash prior to her hospital f/u appointment on Thursday. Please advise

## 2013-06-29 NOTE — Telephone Encounter (Signed)
Ok to call in nystatin mouthwash - 10 cc's tid swish and spit.  8 ounces no refills.

## 2013-07-05 ENCOUNTER — Encounter: Payer: Self-pay | Admitting: Internal Medicine

## 2013-07-05 ENCOUNTER — Ambulatory Visit (INDEPENDENT_AMBULATORY_CARE_PROVIDER_SITE_OTHER): Payer: Medicare Other | Admitting: Internal Medicine

## 2013-07-05 VITALS — BP 120/50 | HR 86 | Temp 98.0°F | Ht 64.25 in | Wt 202.5 lb

## 2013-07-05 DIAGNOSIS — I10 Essential (primary) hypertension: Secondary | ICD-10-CM

## 2013-07-05 DIAGNOSIS — F32A Depression, unspecified: Secondary | ICD-10-CM

## 2013-07-05 DIAGNOSIS — D649 Anemia, unspecified: Secondary | ICD-10-CM

## 2013-07-05 DIAGNOSIS — N289 Disorder of kidney and ureter, unspecified: Secondary | ICD-10-CM

## 2013-07-05 DIAGNOSIS — E119 Type 2 diabetes mellitus without complications: Secondary | ICD-10-CM

## 2013-07-05 DIAGNOSIS — E039 Hypothyroidism, unspecified: Secondary | ICD-10-CM

## 2013-07-05 DIAGNOSIS — F3289 Other specified depressive episodes: Secondary | ICD-10-CM

## 2013-07-05 DIAGNOSIS — F329 Major depressive disorder, single episode, unspecified: Secondary | ICD-10-CM

## 2013-07-05 DIAGNOSIS — G4730 Sleep apnea, unspecified: Secondary | ICD-10-CM

## 2013-07-05 DIAGNOSIS — J449 Chronic obstructive pulmonary disease, unspecified: Secondary | ICD-10-CM

## 2013-07-05 DIAGNOSIS — R002 Palpitations: Secondary | ICD-10-CM

## 2013-07-05 DIAGNOSIS — E785 Hyperlipidemia, unspecified: Secondary | ICD-10-CM

## 2013-07-05 MED ORDER — PIOGLITAZONE HCL 45 MG PO TABS: 45.0000 mg | ORAL_TABLET | Freq: Every day | ORAL | Status: DC

## 2013-07-05 MED ORDER — PREDNISONE 10 MG PO TABS: ORAL_TABLET | ORAL | Status: DC

## 2013-07-05 MED ORDER — PANTOPRAZOLE SODIUM 40 MG PO TBEC: 40.0000 mg | DELAYED_RELEASE_TABLET | Freq: Every day | ORAL | Status: DC

## 2013-07-05 NOTE — Progress Notes (Signed)
Pre-visit discussion using our clinic review tool. No additional management support is needed unless otherwise documented below in the visit note.  

## 2013-07-09 ENCOUNTER — Telehealth: Payer: Self-pay | Admitting: Internal Medicine

## 2013-07-09 NOTE — Telephone Encounter (Signed)
Patients daughter called in states her mother isn't feeling any better and has a lot of congestion. You saw her on Thursday gave her prednisone, she would like to know what she should do. I offered to transfer her to triage nurse she refused. Please advise daughter what she should do for her mom she does leave in [place] so it will take her an hour to get here if she needs to bring her in.

## 2013-07-09 NOTE — Telephone Encounter (Signed)
Daughter called the pulmonary doctor [place] they are going to call the daughter back or send her in medication.She states her mother is too weak to come to the office.

## 2013-07-09 NOTE — Telephone Encounter (Signed)
duplicate

## 2013-07-09 NOTE — Telephone Encounter (Signed)
Noted, I I spoke with patient's daughter Jasmine December(Sharon) prior to her calling us back & informed her that Dr. Lorin PicketScott felt that it would be best if given her persistent sx's that we get her back in with Dr. Meredeth IdeFleming. Pt asked me to hold off on scheduling an appt because she was going to try to call them first and see if they could just call her something in since they have done that for her in the past. (See note below for her response)

## 2013-07-09 NOTE — Telephone Encounter (Signed)
Noted  

## 2013-07-09 NOTE — Telephone Encounter (Signed)
Please advise 

## 2013-07-10 ENCOUNTER — Encounter: Payer: Self-pay | Admitting: Internal Medicine

## 2013-07-10 DIAGNOSIS — N289 Disorder of kidney and ureter, unspecified: Secondary | ICD-10-CM | POA: Insufficient documentation

## 2013-07-10 NOTE — Assessment & Plan Note (Signed)
Continue CPAP.  

## 2013-07-10 NOTE — Assessment & Plan Note (Signed)
Seeing Dr Ward.  Currently stable.  Follow.    

## 2013-07-10 NOTE — Assessment & Plan Note (Signed)
Not an issue for her now.  Follow.  

## 2013-07-10 NOTE — Assessment & Plan Note (Signed)
Has seen GI.  Previous SIEP - negative M spike.  Is being followed at the [facility].  Significant anemia in the hospital.  Required transfusion.  She reports no active bleeding since discharge.  Have discussed outpatient colonoscopy.  She declines.  Desires no further w/up at this point.

## 2013-07-10 NOTE — Assessment & Plan Note (Signed)
Recent admission with COPD exacerbation.  Was doing better.  Some increased congestion this am.  Treat with prednisone taper - starting at 40mg  and decreasing by 5mg  each day until off.  Follow closely.  Continue to follow up with Dr Meredeth IdeFleming.

## 2013-07-10 NOTE — Assessment & Plan Note (Signed)
Low cholesterol diet and exercise.  She is off the Zetia.  Follow lipid panel.   

## 2013-07-10 NOTE — Assessment & Plan Note (Signed)
Had been seeing Dr Sabra Heck.  Up to date with eye exams.  Last urine microalbumin/cr ratio wnl.  Follow met b and a1c. Sugars doing well - (per her report).  Follow.

## 2013-07-10 NOTE — Assessment & Plan Note (Signed)
Hold fosamax.  Follow renal function.  Will schedule an appt with nephrology.

## 2013-07-10 NOTE — Assessment & Plan Note (Signed)
Blood pressure under good control.  Same meds.  Follow metabolic panel.   

## 2013-07-10 NOTE — Assessment & Plan Note (Signed)
On thyroid replacement.  Follow tsh.  

## 2013-07-10 NOTE — Progress Notes (Signed)
Subjective:    Patient ID: Elizabeth [name], female    DOB: 11/13/1925, 80 y.o.   MRN: [id number]  HPI 80 year old female with past history of COPD on oxygen, diabetes, hypertension, hypercholesterolemia and hypothyroidism who comes in today for a hospital follow up.   Accompanied by her daughter.  History obtained by both of them.  She was admitted on 06/25/13 with acute respiratory distress and COPD exacerbation.  Was placed on a prednisone taper and antibiotics.  She had fallen prior to her admission.  No injuries.  Since being home, she is getting stronger.  Has started riding her bike.  Using her walker to get around.  Breathing had been better.  She noticed this am some increased congestion.  Breathing a little "tight".  On prednisone 5mg  daily now.  Is off amlodipine and on diltiazem.  States sugars are doing well.  AM sugars averaging 110-125.  Eating and drinking well.     Past Medical History  Diagnosis Date  . Diabetes mellitus     diabetic retinopathy  . Hypertension   . Hypercholesterolemia   . Hypothyroidism   . Depression   . History of colonic polyps   . COPD (chronic obstructive pulmonary disease)   . Anemia   . Sleep apnea   . Spinal stenosis   . Nephrolithiasis   . Macular degeneration     Current Outpatient Prescriptions on File Prior to Visit  Medication Sig Dispense Refill  . acetaminophen (TYLENOL) 650 MG CR tablet Take 1,300 mg by mouth every 8 (eight) hours as needed for pain.      Marland Kitchen albuterol-ipratropium (COMBIVENT) 18-103 MCG/ACT inhaler Inhale 2 puffs into the lungs 2 (two) times daily.      Marland Kitchen buPROPion (WELLBUTRIN) 100 MG tablet Take 100 mg by mouth 2 (two) times daily.      . Calcium Carb-Cholecalciferol (PRONUTRIENTS CALCIUM+D3 PO) Take by mouth.      . DOXEPIN HCL PO Take by mouth.      . fenofibrate 160 MG tablet Take 1 tablet (160 mg total) by mouth daily.  90 tablet  1  . fluticasone (FLONASE) 50 MCG/ACT nasal spray Place 2 sprays into the nose daily.   48 g  2  . Fluticasone-Salmeterol (ADVAIR) 250-50 MCG/DOSE AEPB Inhale 1 puff into the lungs every 12 (twelve) hours.      . gabapentin (NEURONTIN) 100 MG capsule Take 1 capsule (100 mg total) by mouth daily.  90 capsule  0  . Glucose Blood (BAYER BREEZE 2 TEST) DISK Check blood sugars 4 times daily  150 each  5  . HYDROcodone-acetaminophen (NORCO/VICODIN) 5-325 MG per tablet Take 1 tablet by mouth every 4 (four) hours as needed.      . insulin aspart (NOVOLOG FLEXPEN) 100 UNIT/ML injection Inject into the skin 3 (three) times daily before meals. Inject 4 units at breakfast and 6 units at lunch and supper      . Insulin Pen Needle (B-D UF III MINI PEN NEEDLES) 31G X 5 MM MISC 1 each by Does not apply route 3 (three) times daily.      Marland Kitchen levothyroxine (LEVOTHROID) 25 MCG tablet Take 1 tablet (25 mcg total) by mouth daily.  90 tablet  3  . lisinopril (PRINIVIL,ZESTRIL) 40 MG tablet Take 1 tablet (40 mg total) by mouth daily.  90 tablet  3  . nystatin (MYCOSTATIN) 100000 UNIT/ML suspension 10cc's by mouth (swish & spit) TID  60 mL  0  . Psyllium (  METAMUCIL PO) Take by mouth.      . tiotropium (SPIRIVA) 18 MCG inhalation capsule Place 18 mcg into inhaler and inhale at bedtime.      Marland Kitchen. venlafaxine XR (EFFEXOR-XR) 75 MG 24 hr capsule Take 75 mg by mouth daily.       No current facility-administered medications on file prior to visit.    Review of Systems Patient denies any headache, lightheadedness or dizziness.  No sinus or allergy symptoms.  No chest pain, tightness or palpitations.  Some increased chest congestion as outlined.  Started this am.   No nausea or vomiting.  No acid reflux.  No abdominal pain or cramping.  No bowel change, such as diarrhea, constipation, BRBPR or melana.  No urine change.  Seeing Dr Fransico MichaelBrennan for her macular degeneration.  Sugars as outlined.       Objective:   Physical Exam  Filed Vitals:   07/05/13 1055  BP: 120/50  Pulse: 86  Temp: 98 F (7136.537 C)   80 year old  female in no acute distress.   HEENT:  Nares- clear.  Oropharynx - without lesions. NECK:  Supple.  Nontender.  No audible bruit.  HEART:  Appears to be regular. LUNGS:  No crackles or wheezing audible.  Respirations even and unlabored.  RADIAL PULSE:  Equal bilaterally.  ABDOMEN:  Soft, nontender.  Bowel sounds present and normal.  No audible abdominal bruit.    EXTREMITIES:  No increased edema present.  DP pulses palpable and equal bilaterally.      FEET:  Without lesions.       Assessment & Plan:  CARDIOVASCULAR.   Currently stable.  Continues to follow up with cardiology.     GI.  Colonoscopy 07/28/10 with tubular adenomatous polyps in the cecum.  Had recommended follow up colonoscopy in three years.  With recent significant anemia.  Evaluated by GI in the hospital.  They had recommended a f/u colonoscopy as an outpatient.  She declined.  Desires not to pursue colonoscopy.   No bleeding noted per her report.  Follow.   ABNORMAL MAMMOGRAM.  Had a mammogram 2/13 - BiRADS IV.  Follow up mammogram 8/13.  Seeing Dr Katrinka BlazingSmith.  Biopsy - infiltrating mammary carcinoma of the right breast.  S/p surgery.  Continues to follow up with oncology.    HEALTH MAINTENANCE.  Physical 05/15/13.  She is s/p hysterectomy.  Colonoscopy as outlined.  Mammograms followed by oncology.

## 2013-08-24 ENCOUNTER — Ambulatory Visit: Payer: Self-pay | Admitting: Oncology

## 2013-08-29 ENCOUNTER — Other Ambulatory Visit: Payer: Self-pay | Admitting: *Deleted

## 2013-08-29 ENCOUNTER — Ambulatory Visit: Payer: Self-pay | Admitting: Oncology

## 2013-08-29 MED ORDER — FENOFIBRATE 160 MG PO TABS: 160.0000 mg | ORAL_TABLET | Freq: Every day | ORAL | Status: DC

## 2013-08-29 MED ORDER — LEVOTHYROXINE SODIUM 25 MCG PO TABS: 25.0000 ug | ORAL_TABLET | Freq: Every day | ORAL | Status: DC

## 2013-09-01 ENCOUNTER — Inpatient Hospital Stay: Payer: Self-pay | Admitting: Internal Medicine

## 2013-09-01 LAB — COMPREHENSIVE METABOLIC PANEL
ALK PHOS: 51 U/L
ALT: 29 U/L (ref 12–78)
ANION GAP: 2 — AB (ref 7–16)
AST: 24 U/L (ref 15–37)
Albumin: 3.4 g/dL (ref 3.4–5.0)
BILIRUBIN TOTAL: 0.2 mg/dL (ref 0.2–1.0)
BUN: 40 mg/dL — ABNORMAL HIGH (ref 7–18)
CALCIUM: 9.1 mg/dL (ref 8.5–10.1)
CO2: 37 mmol/L — AB (ref 21–32)
Chloride: 102 mmol/L (ref 98–107)
Creatinine: 1.43 mg/dL — ABNORMAL HIGH (ref 0.60–1.30)
EGFR (Non-African Amer.): 33 — ABNORMAL LOW
GFR CALC AF AMER: 38 — AB
Glucose: 67 mg/dL (ref 65–99)
OSMOLALITY: 289 (ref 275–301)
Potassium: 4.5 mmol/L (ref 3.5–5.1)
Sodium: 141 mmol/L (ref 136–145)
Total Protein: 7.1 g/dL (ref 6.4–8.2)

## 2013-09-01 LAB — PRO B NATRIURETIC PEPTIDE: B-Type Natriuretic Peptide: 2921 pg/mL — ABNORMAL HIGH (ref 0–450)

## 2013-09-01 LAB — CBC
HCT: 27.7 % — ABNORMAL LOW (ref 35.0–47.0)
HGB: 8.4 g/dL — ABNORMAL LOW (ref 12.0–16.0)
MCH: 28.5 pg (ref 26.0–34.0)
MCHC: 30.5 g/dL — ABNORMAL LOW (ref 32.0–36.0)
MCV: 94 fL (ref 80–100)
Platelet: 273 10*3/uL (ref 150–440)
RBC: 2.96 10*6/uL — ABNORMAL LOW (ref 3.80–5.20)
RDW: 15.7 % — AB (ref 11.5–14.5)
WBC: 9.7 10*3/uL (ref 3.6–11.0)

## 2013-09-01 LAB — TROPONIN I: TROPONIN-I: 0.07 ng/mL — AB

## 2013-09-02 LAB — COMPREHENSIVE METABOLIC PANEL
ALT: 25 U/L (ref 12–78)
Albumin: 3.3 g/dL — ABNORMAL LOW (ref 3.4–5.0)
Alkaline Phosphatase: 44 U/L — ABNORMAL LOW
Anion Gap: 1 — ABNORMAL LOW (ref 7–16)
BUN: 41 mg/dL — ABNORMAL HIGH (ref 7–18)
Bilirubin,Total: 0.4 mg/dL (ref 0.2–1.0)
CHLORIDE: 100 mmol/L (ref 98–107)
CO2: 38 mmol/L — AB (ref 21–32)
CREATININE: 1.37 mg/dL — AB (ref 0.60–1.30)
Calcium, Total: 9.1 mg/dL (ref 8.5–10.1)
EGFR (African American): 40 — ABNORMAL LOW
GFR CALC NON AF AMER: 35 — AB
GLUCOSE: 129 mg/dL — AB (ref 65–99)
OSMOLALITY: 289 (ref 275–301)
POTASSIUM: 4.3 mmol/L (ref 3.5–5.1)
SGOT(AST): 22 U/L (ref 15–37)
SODIUM: 139 mmol/L (ref 136–145)
TOTAL PROTEIN: 6.9 g/dL (ref 6.4–8.2)

## 2013-09-02 LAB — CBC WITH DIFFERENTIAL/PLATELET
BASOS ABS: 0 10*3/uL (ref 0.0–0.1)
BASOS PCT: 0.4 %
Eosinophil #: 0.1 10*3/uL (ref 0.0–0.7)
Eosinophil %: 0.6 %
HCT: 27.4 % — AB (ref 35.0–47.0)
HGB: 8.8 g/dL — ABNORMAL LOW (ref 12.0–16.0)
Lymphocyte #: 0.3 10*3/uL — ABNORMAL LOW (ref 1.0–3.6)
Lymphocyte %: 2.5 %
MCH: 30.1 pg (ref 26.0–34.0)
MCHC: 32.2 g/dL (ref 32.0–36.0)
MCV: 93 fL (ref 80–100)
MONOS PCT: 2.5 %
Monocyte #: 0.3 x10 3/mm (ref 0.2–0.9)
NEUTROS ABS: 9.9 10*3/uL — AB (ref 1.4–6.5)
NEUTROS PCT: 94 %
Platelet: 266 10*3/uL (ref 150–440)
RBC: 2.93 10*6/uL — AB (ref 3.80–5.20)
RDW: 15.7 % — AB (ref 11.5–14.5)
WBC: 10.5 10*3/uL (ref 3.6–11.0)

## 2013-09-02 LAB — LIPID PANEL
Cholesterol: 192 mg/dL (ref 0–200)
HDL: 125 mg/dL — AB (ref 40–60)
Ldl Cholesterol, Calc: 54 mg/dL (ref 0–100)
Triglycerides: 65 mg/dL (ref 0–200)
VLDL Cholesterol, Calc: 13 mg/dL (ref 5–40)

## 2013-09-02 LAB — CK TOTAL AND CKMB (NOT AT ARMC)
CK, TOTAL: 79 U/L
CK, Total: 95 U/L
CK-MB: 1.7 ng/mL (ref 0.5–3.6)
CK-MB: 1.8 ng/mL (ref 0.5–3.6)

## 2013-09-02 LAB — TROPONIN I
TROPONIN-I: 0.08 ng/mL — AB
Troponin-I: 0.06 ng/mL — ABNORMAL HIGH

## 2013-09-03 LAB — CK TOTAL AND CKMB (NOT AT ARMC)
CK, Total: 78 U/L
CK-MB: 1 ng/mL (ref 0.5–3.6)

## 2013-09-03 LAB — BASIC METABOLIC PANEL
ANION GAP: 6 — AB (ref 7–16)
BUN: 58 mg/dL — ABNORMAL HIGH (ref 7–18)
Calcium, Total: 9.2 mg/dL (ref 8.5–10.1)
Chloride: 96 mmol/L — ABNORMAL LOW (ref 98–107)
Co2: 37 mmol/L — ABNORMAL HIGH (ref 21–32)
Creatinine: 1.36 mg/dL — ABNORMAL HIGH (ref 0.60–1.30)
EGFR (African American): 41 — ABNORMAL LOW
EGFR (Non-African Amer.): 35 — ABNORMAL LOW
Glucose: 227 mg/dL — ABNORMAL HIGH (ref 65–99)
Osmolality: 301 (ref 275–301)
Potassium: 4.4 mmol/L (ref 3.5–5.1)
SODIUM: 139 mmol/L (ref 136–145)

## 2013-09-03 LAB — MAGNESIUM: MAGNESIUM: 1.8 mg/dL

## 2013-09-04 LAB — BASIC METABOLIC PANEL
Anion Gap: 3 — ABNORMAL LOW (ref 7–16)
BUN: 62 mg/dL — AB (ref 7–18)
CREATININE: 1.5 mg/dL — AB (ref 0.60–1.30)
Calcium, Total: 9.4 mg/dL (ref 8.5–10.1)
Chloride: 94 mmol/L — ABNORMAL LOW (ref 98–107)
Co2: 39 mmol/L — ABNORMAL HIGH (ref 21–32)
EGFR (African American): 36 — ABNORMAL LOW
EGFR (Non-African Amer.): 31 — ABNORMAL LOW
GLUCOSE: 186 mg/dL — AB (ref 65–99)
Osmolality: 294 (ref 275–301)
Potassium: 4.4 mmol/L (ref 3.5–5.1)
Sodium: 136 mmol/L (ref 136–145)

## 2013-09-05 ENCOUNTER — Telehealth: Payer: Self-pay | Admitting: *Deleted

## 2013-09-05 LAB — BASIC METABOLIC PANEL
ANION GAP: 3 — AB (ref 7–16)
BUN: 55 mg/dL — ABNORMAL HIGH (ref 7–18)
CO2: 42 mmol/L — AB (ref 21–32)
Calcium, Total: 9.5 mg/dL (ref 8.5–10.1)
Chloride: 92 mmol/L — ABNORMAL LOW (ref 98–107)
Creatinine: 1.4 mg/dL — ABNORMAL HIGH (ref 0.60–1.30)
EGFR (African American): 39 — ABNORMAL LOW
EGFR (Non-African Amer.): 34 — ABNORMAL LOW
Glucose: 196 mg/dL — ABNORMAL HIGH (ref 65–99)
OSMOLALITY: 294 (ref 275–301)
POTASSIUM: 4 mmol/L (ref 3.5–5.1)
SODIUM: 137 mmol/L (ref 136–145)

## 2013-09-05 NOTE — Telephone Encounter (Signed)
Gavin Poundeborah with [facility] called to report that Ms. Jethro BastosBunn is still at [facility] & expected to be discharged soon (maybe today). She is requesting verbal orders so that pt can be sent home with a home health aide. Will need to call her with order ([phone]). I also faxed the last copy of recent labs (within 90 days) that contained hgb per new Medicare requirements (faxed to: [phone]).

## 2013-09-05 NOTE — Telephone Encounter (Signed)
Verbal ok given for home health aide.

## 2013-09-06 ENCOUNTER — Ambulatory Visit: Payer: Self-pay | Admitting: Oncology

## 2013-09-06 NOTE — Telephone Encounter (Signed)
Debra left a voicemail on Ar[employer]La[employer]oya's mailbox stating the same information as noted below. Returned the call and spoke with Amil AmenJulia, informed her the last results we have are from 04/2013. Per Amil AmenJulia go ahead and fax those results if that does not work then she will let us know. Results printed and faxed to [phone]

## 2013-09-06 NOTE — Telephone Encounter (Signed)
If this is the last one we have, you can let them know the date of the one we have here.  Ok to give verbal order for a1c to be drawn.

## 2013-09-06 NOTE — Telephone Encounter (Signed)
Last A1C in chart is 05/15/13

## 2013-09-06 NOTE — Telephone Encounter (Signed)
Elizabeth [name] called back and they need verbal to draw venipuncture A1C, verbal ok given.

## 2013-09-06 NOTE — Telephone Encounter (Signed)
Gavin Poundeborah with [facility] states they did receive labs that were faxed yesterday but the labs did not include an A1c.  States if we do not have an A1c on fiile within last 90 days they will need an order from Dr. Lorin PicketScott so that they can draw on patient tomorrow.  Pt will be seen by their nurse between 10-11 a.m. 4/10.  States this is a new Medicare requirement, to have the A1c on file if they are seeing her for diabetes.

## 2013-09-12 NOTE — Telephone Encounter (Signed)
Received lab results from [facility] (Result in your folder): Hemoglobin A1c results: 6.5 (High)

## 2013-09-12 NOTE — Telephone Encounter (Signed)
Nothing needed. 

## 2013-09-12 NOTE — Telephone Encounter (Signed)
Noted.  Do I need to do anything with this.  If no, just close.  Thanks.

## 2013-09-13 ENCOUNTER — Ambulatory Visit (INDEPENDENT_AMBULATORY_CARE_PROVIDER_SITE_OTHER): Payer: Medicare Other | Admitting: Internal Medicine

## 2013-09-13 ENCOUNTER — Encounter: Payer: Self-pay | Admitting: Internal Medicine

## 2013-09-13 VITALS — BP 130/58 | HR 80 | Temp 98.2°F | Ht 64.25 in | Wt 198.5 lb

## 2013-09-13 DIAGNOSIS — I10 Essential (primary) hypertension: Secondary | ICD-10-CM

## 2013-09-13 DIAGNOSIS — F3289 Other specified depressive episodes: Secondary | ICD-10-CM

## 2013-09-13 DIAGNOSIS — E119 Type 2 diabetes mellitus without complications: Secondary | ICD-10-CM

## 2013-09-13 DIAGNOSIS — D649 Anemia, unspecified: Secondary | ICD-10-CM

## 2013-09-13 DIAGNOSIS — R002 Palpitations: Secondary | ICD-10-CM

## 2013-09-13 DIAGNOSIS — J449 Chronic obstructive pulmonary disease, unspecified: Secondary | ICD-10-CM

## 2013-09-13 DIAGNOSIS — F329 Major depressive disorder, single episode, unspecified: Secondary | ICD-10-CM

## 2013-09-13 DIAGNOSIS — F32A Depression, unspecified: Secondary | ICD-10-CM

## 2013-09-13 DIAGNOSIS — N289 Disorder of kidney and ureter, unspecified: Secondary | ICD-10-CM

## 2013-09-13 DIAGNOSIS — G4730 Sleep apnea, unspecified: Secondary | ICD-10-CM

## 2013-09-13 DIAGNOSIS — L600 Ingrowing nail: Secondary | ICD-10-CM

## 2013-09-13 DIAGNOSIS — E785 Hyperlipidemia, unspecified: Secondary | ICD-10-CM

## 2013-09-13 DIAGNOSIS — E039 Hypothyroidism, unspecified: Secondary | ICD-10-CM

## 2013-09-13 MED ORDER — LISINOPRIL 40 MG PO TABS: 40.0000 mg | ORAL_TABLET | Freq: Every day | ORAL | Status: AC

## 2013-09-13 MED ORDER — GABAPENTIN 100 MG PO CAPS: 100.0000 mg | ORAL_CAPSULE | Freq: Every day | ORAL | Status: AC

## 2013-09-16 ENCOUNTER — Encounter: Payer: Self-pay | Admitting: Internal Medicine

## 2013-09-16 DIAGNOSIS — L600 Ingrowing nail: Secondary | ICD-10-CM | POA: Insufficient documentation

## 2013-09-16 MED ORDER — IPRATROPIUM BROMIDE 0.03 % NA SOLN: 2.0000 | Freq: Two times a day (BID) | NASAL | Status: AC

## 2013-09-16 NOTE — Assessment & Plan Note (Signed)
Low cholesterol diet and exercise.  She is off the Zetia.  Follow lipid panel.   

## 2013-09-16 NOTE — Assessment & Plan Note (Signed)
Hold fosamax.  Follow renal function.

## 2013-09-16 NOTE — Progress Notes (Signed)
Subjective:    Patient ID: Elizabeth [name], female    DOB: 1925-10-28, [age] y.o.   MRN: [id number]  HPI 80 year old female with past history of COPD on oxygen, diabetes, hypertension, hypercholesterolemia and hypothyroidism who comes in today for a hospital follow up.   Accompanied by her daughter.  History obtained by both of them.  She was admitted 09/01/13 - 09/05/13 with sob and chest tightness.   Was found to have an exacerbation of CHF and COPD exacerbation.  Was given IV lasix and steroids.  She is tapering the steroids and completed her antibiotic therapy.  Breathing better.  Daughter reports she has done well since being discharged.  Is up doing more.  Is more independent.  She is living with her daughter.   States sugars are doing well.  AM sugars averaging <150 and pm sugars two hours after eating supper <140.  Eating and drinking well.  Does have an ingrown toenail.  Request podiatry referral.     Past Medical History  Diagnosis Date  . Diabetes mellitus     diabetic retinopathy  . Hypertension   . Hypercholesterolemia   . Hypothyroidism   . Depression   . History of colonic polyps   . COPD (chronic obstructive pulmonary disease)   . Anemia   . Sleep apnea   . Spinal stenosis   . Nephrolithiasis   . Macular degeneration     Current Outpatient Prescriptions on File Prior to Visit  Medication Sig Dispense Refill  . acetaminophen (TYLENOL) 650 MG CR tablet Take 1,300 mg by mouth every 8 (eight) hours as needed for pain.      Marland Kitchen. albuterol-ipratropium (COMBIVENT) 18-103 MCG/ACT inhaler Inhale 2 puffs into the lungs 2 (two) times daily.      Marland Kitchen. buPROPion (WELLBUTRIN) 100 MG tablet Take 100 mg by mouth 2 (two) times daily.      . Calcium Carb-Cholecalciferol (PRONUTRIENTS CALCIUM+D3 PO) Take by mouth.      . diltiazem (DILACOR XR) 240 MG 24 hr capsule Take 240 mg by mouth daily.      Marland Kitchen. DOXEPIN HCL PO Take by mouth.      . fenofibrate 160 MG tablet Take 1 tablet (160 mg total) by mouth  daily.  90 tablet  1  . fluticasone (FLONASE) 50 MCG/ACT nasal spray Place 2 sprays into the nose daily.  48 g  2  . Fluticasone-Salmeterol (ADVAIR) 250-50 MCG/DOSE AEPB Inhale 1 puff into the lungs every 12 (twelve) hours.      . Glucose Blood (BAYER BREEZE 2 TEST) DISK Check blood sugars 4 times daily  150 each  5  . HYDROcodone-acetaminophen (NORCO/VICODIN) 5-325 MG per tablet Take 1 tablet by mouth every 4 (four) hours as needed.      . insulin aspart (NOVOLOG FLEXPEN) 100 UNIT/ML injection Inject into the skin 3 (three) times daily before meals. Inject 4 units at breakfast and 6 units at lunch and supper      . Insulin Pen Needle (B-D UF III MINI PEN NEEDLES) 31G X 5 MM MISC 1 each by Does not apply route 3 (three) times daily.      Marland Kitchen. levothyroxine (LEVOTHROID) 25 MCG tablet Take 1 tablet (25 mcg total) by mouth daily.  90 tablet  3  . nystatin (MYCOSTATIN) 100000 UNIT/ML suspension 10cc's by mouth (swish & spit) TID  60 mL  0  . pantoprazole (PROTONIX) 40 MG tablet Take 1 tablet (40 mg total) by mouth daily.  90  tablet  3  . predniSONE (DELTASONE) 10 MG tablet Take 4 tablets x 1 day and then decrease by 1/2 tablet per day until down to zero mg.  18 tablet  0  . Psyllium (METAMUCIL PO) Take by mouth.      . tiotropium (SPIRIVA) 18 MCG inhalation capsule Place 18 mcg into inhaler and inhale at bedtime.      Marland Kitchen. venlafaxine XR (EFFEXOR-XR) 75 MG 24 hr capsule Take 75 mg by mouth daily.       No current facility-administered medications on file prior to visit.    Review of Systems Patient denies any headache, lightheadedness or dizziness.  No sinus or allergy symptoms.  No chest pain, tightness or palpitations.  No increased chest congestion.  No cough.  Breathing better.   No nausea or vomiting.  No acid reflux.  No abdominal pain or cramping.  No bowel change, such as diarrhea, constipation, BRBPR or melana.  No urine change.  Seeing Dr Fransico MichaelBrennan for her macular degeneration.  Sugars as outlined.   Feels better.        Objective:   Physical Exam  Filed Vitals:   09/13/13 1116  BP: 130/58  Pulse: 80  Temp: 98.2 F (5836.408 C)   80 year old female in no acute distress.   HEENT:  Nares- clear.  Oropharynx - without lesions. NECK:  Supple.  Nontender.  No audible bruit.  HEART:  Appears to be regular. II/VI systolic murmur.  LUNGS:  No crackles or wheezing audible.  Respirations even and unlabored.  RADIAL PULSE:  Equal bilaterally.  ABDOMEN:  Soft, nontender.  Bowel sounds present and normal.  No audible abdominal bruit.    EXTREMITIES:  No increased edema present.  DP pulses palpable and equal bilaterally.      FEET:  Without lesions.  Did have ingrown toe nail (great toe).       Assessment & Plan:  CARDIOVASCULAR.   Currently doing better.  Continues to follow up with cardiology.  Continue current medication regimen.     GI.  Colonoscopy 07/28/10 with tubular adenomatous polyps in the cecum.  Had recommended follow up colonoscopy in three years.  With recent significant anemia.  Evaluated by GI in the hospital.  They had recommended a f/u colonoscopy as an outpatient.  She declined.  Desires not to pursue colonoscopy.   No bleeding noted per her report.  Follow.   ABNORMAL MAMMOGRAM.  Had a mammogram 2/13 - BiRADS IV.  Follow up mammogram 8/13.  Seeing Dr Katrinka BlazingSmith.  Biopsy - infiltrating mammary carcinoma of the right breast.  S/p surgery.  Continues to follow up with oncology.  Need latest mammogram results.   HEALTH MAINTENANCE.  Physical 05/15/13.  She is s/p hysterectomy.  Colonoscopy as outlined.  Mammograms followed by oncology.   I spent 40 minutes with the patient and more than 50% of the time was spent in consultation regarding the above.

## 2013-09-16 NOTE — Assessment & Plan Note (Signed)
Had been seeing Dr Sabra Heck.  Up to date with eye exams.  Last urine microalbumin/cr ratio wnl.  Follow met b and a1c. Sugars as outlined.  Off actos. Follow.

## 2013-09-16 NOTE — Assessment & Plan Note (Signed)
Blood pressure under good control.  Same meds.  Follow metabolic panel.   

## 2013-09-16 NOTE — Assessment & Plan Note (Signed)
She is diabetic.  Refer to podiatry for further evaluation and treatment.

## 2013-09-16 NOTE — Assessment & Plan Note (Signed)
Continue CPAP.  

## 2013-09-16 NOTE — Assessment & Plan Note (Signed)
Seeing Dr Elesa MassedWard.  Currently stable.  Follow.

## 2013-09-16 NOTE — Assessment & Plan Note (Signed)
Not an issue for her now.  Follow.  

## 2013-09-16 NOTE — Assessment & Plan Note (Signed)
On thyroid replacement.  Follow tsh.  

## 2013-09-16 NOTE — Assessment & Plan Note (Signed)
Recent admission with COPD exacerbation.  Treated with abx and prednisone taper.  Breathing better.  On oxygen.  Follow.

## 2013-09-16 NOTE — Assessment & Plan Note (Signed)
Has seen GI.  Previous SIEP - negative M spike.  Is being [facility].   Have discussed outpatient colonoscopy.  She declines.  Desires no further w/up at this point.  Follow cbc.

## 2013-09-28 ENCOUNTER — Ambulatory Visit: Payer: Self-pay | Admitting: Podiatry

## 2013-09-28 ENCOUNTER — Ambulatory Visit: Payer: Self-pay | Admitting: Oncology

## 2013-10-05 ENCOUNTER — Ambulatory Visit: Payer: Self-pay | Admitting: Podiatry

## 2013-10-16 ENCOUNTER — Encounter: Payer: Self-pay | Admitting: Internal Medicine

## 2013-10-23 ENCOUNTER — Ambulatory Visit: Payer: Self-pay | Admitting: Surgery

## 2013-10-25 ENCOUNTER — Telehealth: Payer: Self-pay | Admitting: *Deleted

## 2013-10-25 MED ORDER — GLUCOSE BLOOD VI DISK: DISK | Status: AC

## 2013-10-25 NOTE — Telephone Encounter (Signed)
Request Refill   Bayer Breeze 2 gluc  Use to check blood three times daily

## 2013-10-26 ENCOUNTER — Telehealth: Payer: Self-pay | Admitting: Internal Medicine

## 2013-10-26 NOTE — Telephone Encounter (Signed)
Elizabeth [name], Can you get more information from them about blood sugar readings? I would prefer to have several days of readings before making any medication adjustments. Thanks

## 2013-10-26 NOTE — Telephone Encounter (Signed)
Spoke with daughter & notified her of recommendations. She would prefer that Ms. Wise come in either Thurs or Friday of next week.

## 2013-10-26 NOTE — Telephone Encounter (Signed)
Patient Information:  Caller Name: Jasmine December  Phone: [phone]  Patient: Elizabeth [name]  Gender: Female  DOB: 12-17-1925  Age: 80 Years  PCP: Dale Ajo  Office Follow Up:  Does the office need to follow up with this patient?: Yes  Instructions For The Office: See RN Note.  RN Note:  PLEASE ADVISE AND FOLLOW UP WITH SHARON. Jasmine December may be reached at [phone] until 1pm today. After 1 pm, Jasmine December will be in a job interview and her mother (patient) will answer after that time. Jasmine December requests that if speaking to her mother (patient) then please be sure she fully understands what she is to do when giving instructions.  Symptoms  Reason For Call & Symptoms: High Blood Sugar Readings x  past 2 weeks. Home Health Nurse last week advised patient may need adjustment in insulin as a result of discontinuing Actos x 6 weeks now. Most recent blood sugar reading @ 10:50am was 260. Patient rode bicycle and rechecked at 11:20 with value of 143. When blood sugar drops like this caller reports patient feels "lethargic."  Reviewed Health History In EMR: Yes  Reviewed Medications In EMR: Yes  Reviewed Allergies In EMR: Yes  Reviewed Surgeries / Procedures: Yes  Date of Onset of Symptoms: 10/12/2013  Guideline(s) Used:  Diabetes - High Blood Sugar  Disposition Per Guideline:   Discuss with PCP and Callback by Nurse Today  Reason For Disposition Reached:   Caller has NON-URGENT medication question about med that PCP prescribed and triager unable to answer question  Advice Given:  N/A  Patient Will Follow Care Advice:  YES

## 2013-10-26 NOTE — Telephone Encounter (Signed)
Spoke with patient & blood sugar records are as follows: (Mostly bedtime readings).   143,133,213,176,183,176,212,127,132,166,155,136,214,281,159,233,111,86,56,256,335,147,108,119,122    Pt states that she has been under a lot of stress because children have to leave her some during the day to go to work.

## 2013-10-26 NOTE — Telephone Encounter (Signed)
I think these blood sugar readings are okay. I would not recommend increasing medications based on these numbers. I worry about an 80 year old who is having BG 86,56.  I would recommend that we set up a follow up with Dr. Lorin Picket next week.

## 2013-10-26 NOTE — Telephone Encounter (Signed)
Please advise (Dr. Roby Lofts patient)

## 2013-10-26 NOTE — Telephone Encounter (Signed)
(  See messages below) Made several attempts to reach [name] busy. Left detailed message on [facility]. Please advise on an appt date/time for Ms. Elizabeth [name]

## 2013-10-28 NOTE — Telephone Encounter (Signed)
She needs to check her sugars at least bid to tid.  Make sure eating regular meals.  Need to make sure eating a bedtime snack.  Let us know if sugars low.  The only place now that I can work her in Thursday or Friday (is Thursday at 12:15).  If there is a cancellation somewhere else - may move.

## 2013-10-28 NOTE — Telephone Encounter (Signed)
Duplicate.  See previous message.   

## 2013-10-29 ENCOUNTER — Ambulatory Visit: Payer: Self-pay | Admitting: Oncology

## 2013-10-29 NOTE — Telephone Encounter (Signed)
Pt & daughter Jasmine December) notified & f/u appt scheduled for: 11/01/13 (Thu) @ 12:15 PM

## 2013-10-31 ENCOUNTER — Telehealth: Payer: Self-pay | Admitting: Internal Medicine

## 2013-10-31 NOTE — Telephone Encounter (Signed)
Called and cancelled patients appointment for tomorrow states that since patient has been watching what she eats her sugars have gone down.

## 2013-10-31 NOTE — Telephone Encounter (Signed)
There was some concern with her previous recorded sugars of the sugar being too low.  Please confirm pt not having problems with low blood sugars.  Thanks.

## 2013-11-01 ENCOUNTER — Ambulatory Visit: Payer: Medicare Other | Admitting: Internal Medicine

## 2013-11-01 NOTE — Telephone Encounter (Signed)
Tried to reach pt on home number. No voicemail

## 2013-11-02 NOTE — Telephone Encounter (Signed)
Spoke with daughter & she states that she is doing well & sugars are running in the 100 good ranges.

## 2013-11-27 ENCOUNTER — Telehealth: Payer: Self-pay | Admitting: *Deleted

## 2013-11-27 NOTE — Telephone Encounter (Signed)
Have them bring in record of sugars bid and let me review.  May need to adjust medication more.  Confirm no other acute infectious symptoms.

## 2013-11-27 NOTE — Telephone Encounter (Signed)
Spoke with pts daughter advised of MDs message.  She states she would bring in record on Wed 7.10.15

## 2013-11-27 NOTE — Telephone Encounter (Signed)
Jasmine DecemberSharon called states since pt discontinued Actos in April her blood sugars have not been regulated. States her blood sugars have been running high.  6.30.15 a.m./ 129;  6.29.15 afternoon./ 267; 6.27.15 afternoon/316, p.m. /266.  Please advise

## 2013-12-06 ENCOUNTER — Telehealth: Payer: Self-pay | Admitting: Internal Medicine

## 2013-12-06 NOTE — Telephone Encounter (Signed)
[name] dropped off a list of Mrs. Elizabeth [name]'s  last weeks blood sugars. List is in Dr. Roby LoftsScott's box. Please call [name] if a rx is being called in/msn

## 2013-12-06 NOTE — Telephone Encounter (Signed)
Placed in your folder.

## 2013-12-07 NOTE — Telephone Encounter (Signed)
Spoke to pt and her daughter.  She has recently been on prednisone taper.  Sugars elevated secondary to this.  She has adjusted her insulin up some based on her po intake and her sugar reading.  Explained would like to get more of a trend of what sugars will do since she is back on her maintenance prednisone.  Also discussed my concern of her dropping too low.  Her sugars are averaging 106-130 in the am.  Noon sugars are trending down since on lower prednisone (312 down to 270).  Her night sugars are varying - 249, 221, 183 and 117.  We discussed the possibility of using Lantus in the am if sugars remain elevated at noon and in the evening.  I discussed the importance of eating regular meals and eating a bedtime snack.  Discussed the risk of low blood sugars.  She will check and record her sugars over the weekend.  Please call her Monday pm and have her give us her sugar readings from today until Monday.  Thanks.  Pt and daughter were comfortable with this plan.  Daughter explained she was "just stressed" with the earlier phone call.

## 2013-12-07 NOTE — Telephone Encounter (Signed)
I appreciate them sending in a few days of blood sugars, but these readings really vary.  Some are 106 and some upper 200s.  Given this, I need more readings to view the trend of her sugars.  We are not looking for tight control.  I do need to confirm that she is still using/taking her insulin.  Please document how much insulin she is giving herself and now often she is taking the insulin.  May just need to adjust her lunch time insulin if lunch sugars remain elevated (instead of adding a new medication).  Let me know if any problems.

## 2013-12-07 NOTE — Telephone Encounter (Signed)
Daughter called back stating that she is currently taking 6-am,8-lunch,10-pm. Also reported that her sugars were at 275 this morning at 10:19 after eating a bowl of oatmeal. Daughter states that she has an upcoming appt in 2 weeks and she does not want to be angry or pissed off when she comes in. She states that the insulin is not carrying her through (short acting). She feels that she needs a replacement for the Actos (pill form). Would like to know something today & states that it always takes a long time to hear back and this has been going on too long now.

## 2013-12-07 NOTE — Telephone Encounter (Signed)
See attached note.  Duplicate.

## 2013-12-07 NOTE — Telephone Encounter (Signed)
The patient's daughter's is going back home to [place] today @ 3:00. The daughter stated that after the patient's blood sugar was called to the nurse that the physician would determine if medication was needed and call it into the pharmacy . If any medication is to be called to the pharmacy she wants it called into the pharmacy before 3:00 in order to pick it up for her mother.

## 2013-12-10 NOTE — Telephone Encounter (Signed)
Spoke with Ms. Jethro BastosBunn, she states that her readings are as follows:  Friday: 130-am, 240-noon, 170-pm Saturday: 135-am, 215-noon, 250-pm, 175-QHS Sunday: 120-am, 360-noon (ate a lot of mints),360-pm Monday: 131-am, 309-noon, 142-pm

## 2013-12-11 NOTE — Telephone Encounter (Signed)
Pt.notified

## 2013-12-11 NOTE — Telephone Encounter (Signed)
Notify pt that given her blood sugar readings, I would like for her to increase her am insulin from 6 units to 8 units (in the am).  The remainder of her insulin doses stay the same.  I want her to check and record her sugars like she has been doing.  We will adjust as needed.

## 2013-12-18 ENCOUNTER — Ambulatory Visit: Payer: Medicare Other | Admitting: Internal Medicine

## 2013-12-18 ENCOUNTER — Telehealth: Payer: Self-pay | Admitting: Internal Medicine

## 2013-12-18 ENCOUNTER — Other Ambulatory Visit: Payer: Self-pay | Admitting: Internal Medicine

## 2013-12-18 DIAGNOSIS — D649 Anemia, unspecified: Secondary | ICD-10-CM

## 2013-12-18 DIAGNOSIS — N289 Disorder of kidney and ureter, unspecified: Secondary | ICD-10-CM

## 2013-12-18 DIAGNOSIS — E039 Hypothyroidism, unspecified: Secondary | ICD-10-CM

## 2013-12-18 DIAGNOSIS — E1129 Type 2 diabetes mellitus with other diabetic kidney complication: Secondary | ICD-10-CM

## 2013-12-18 DIAGNOSIS — E785 Hyperlipidemia, unspecified: Secondary | ICD-10-CM

## 2013-12-18 DIAGNOSIS — I10 Essential (primary) hypertension: Secondary | ICD-10-CM

## 2013-12-18 NOTE — Telephone Encounter (Signed)
I was told that she cancelled because she thought she was only having labs today

## 2013-12-18 NOTE — Telephone Encounter (Signed)
Noted.  If she wants to come in to her appt - ok.  Otherwise, notify her of info in previous message.  Thanks

## 2013-12-18 NOTE — Progress Notes (Signed)
Orders placed for labs

## 2013-12-18 NOTE — Telephone Encounter (Signed)
Pt wanted to know when she will have lab work again. It has been at least 6 months since her last lab visit. No orders for lab work. Pt canceled appt for today.msn

## 2013-12-18 NOTE — Telephone Encounter (Signed)
Called pt, no answer and unable to leave message

## 2013-12-18 NOTE — Telephone Encounter (Signed)
Please advise 

## 2013-12-18 NOTE — Telephone Encounter (Signed)
I was going to schedule her follow up labs at her appt today.  She last had her a1c checked in April 2015.  She can come in within the week for fasting labs.  Can schedule appt.  She will also need to reschedule her appt from today.  Will need to be a 30 minute appt).  Why did she cancel today?

## 2013-12-19 NOTE — Telephone Encounter (Signed)
Pt states that the weather was too hot to come to the office yesterday.  Advised pt that she would need to reschedule appt, Pt states that she only wants to have labs done, does not want to come here every 3 mths, that she has too many doctor bills to pay every month.  Advised pt that should would have to schedule follow up appts with Dr in order to receive medication refills. Transferred pt to Melissa to schedule lab appt and follow up appt.

## 2013-12-28 ENCOUNTER — Other Ambulatory Visit (INDEPENDENT_AMBULATORY_CARE_PROVIDER_SITE_OTHER): Payer: Medicare Other

## 2013-12-28 DIAGNOSIS — D649 Anemia, unspecified: Secondary | ICD-10-CM

## 2013-12-28 DIAGNOSIS — E1129 Type 2 diabetes mellitus with other diabetic kidney complication: Secondary | ICD-10-CM

## 2013-12-28 DIAGNOSIS — E039 Hypothyroidism, unspecified: Secondary | ICD-10-CM

## 2013-12-28 DIAGNOSIS — E785 Hyperlipidemia, unspecified: Secondary | ICD-10-CM

## 2013-12-28 LAB — BASIC METABOLIC PANEL
BUN: 23 mg/dL (ref 6–23)
CO2: 36 mEq/L — ABNORMAL HIGH (ref 19–32)
Calcium: 9.3 mg/dL (ref 8.4–10.5)
Chloride: 99 mEq/L (ref 96–112)
Creatinine, Ser: 1 mg/dL (ref 0.4–1.2)
GFR: 55.82 mL/min — AB (ref >60.00)
GLUCOSE: 131 mg/dL — AB (ref 70–99)
POTASSIUM: 4.9 meq/L (ref 3.5–5.1)
SODIUM: 140 meq/L (ref 135–145)

## 2013-12-28 LAB — CBC WITH DIFFERENTIAL/PLATELET
Basophils Absolute: 0 10*3/uL (ref 0.0–0.1)
Basophils Relative: 0.4 % (ref 0.0–3.0)
EOS PCT: 3.6 % (ref 0.0–5.0)
Eosinophils Absolute: 0.3 10*3/uL (ref 0.0–0.7)
HEMATOCRIT: 29.7 % — AB (ref 36.0–46.0)
Hemoglobin: 9.6 g/dL — ABNORMAL LOW (ref 12.0–15.0)
Lymphocytes Relative: 15.3 % (ref 12.0–46.0)
Lymphs Abs: 1.5 10*3/uL (ref 0.7–4.0)
MCHC: 32.3 g/dL (ref 30.0–36.0)
MCV: 89.5 fl (ref 78.0–100.0)
MONOS PCT: 8.7 % (ref 3.0–12.0)
Monocytes Absolute: 0.8 10*3/uL (ref 0.1–1.0)
NEUTROS PCT: 72 % (ref 43.0–77.0)
Neutro Abs: 6.9 10*3/uL (ref 1.4–7.7)
PLATELETS: 336 10*3/uL (ref 150.0–400.0)
RBC: 3.32 Mil/uL — AB (ref 3.87–5.11)
RDW: 15.9 % — ABNORMAL HIGH (ref 11.5–15.5)
WBC: 9.6 10*3/uL (ref 4.0–10.5)

## 2013-12-28 LAB — LIPID PANEL
CHOLESTEROL: 191 mg/dL (ref 0–200)
HDL: 86.9 mg/dL (ref >39.00)
LDL CALC: 83 mg/dL (ref 0–99)
NonHDL: 104.1
TRIGLYCERIDES: 107 mg/dL (ref 0.0–149.0)
Total CHOL/HDL Ratio: 2
VLDL: 21.4 mg/dL (ref 0.0–40.0)

## 2013-12-28 LAB — HEPATIC FUNCTION PANEL
ALBUMIN: 3.4 g/dL — AB (ref 3.5–5.2)
ALT: 19 U/L (ref 0–35)
AST: 21 U/L (ref 0–37)
Alkaline Phosphatase: 41 U/L (ref 39–117)
Bilirubin, Direct: 0.1 mg/dL (ref 0.0–0.3)
TOTAL PROTEIN: 6 g/dL (ref 6.0–8.3)
Total Bilirubin: 0.4 mg/dL (ref 0.2–1.2)

## 2013-12-28 LAB — HEMOGLOBIN A1C: Hgb A1c MFr Bld: 6.9 % — ABNORMAL HIGH (ref 4.6–6.5)

## 2013-12-28 LAB — TSH: TSH: 5.05 u[IU]/mL — AB (ref 0.35–4.50)

## 2013-12-29 ENCOUNTER — Other Ambulatory Visit: Payer: Self-pay | Admitting: Internal Medicine

## 2013-12-29 DIAGNOSIS — E039 Hypothyroidism, unspecified: Secondary | ICD-10-CM

## 2013-12-29 NOTE — Progress Notes (Signed)
Order placed for tsh 

## 2013-12-31 ENCOUNTER — Other Ambulatory Visit: Payer: Self-pay | Admitting: *Deleted

## 2013-12-31 MED ORDER — LEVOTHYROXINE SODIUM 50 MCG PO TABS: 50.0000 ug | ORAL_TABLET | Freq: Every day | ORAL | Status: AC

## 2013-12-31 MED ORDER — LEVOTHYROXINE SODIUM 50 MCG PO TABS: 25.0000 ug | ORAL_TABLET | Freq: Every day | ORAL | Status: DC

## 2014-01-01 ENCOUNTER — Telehealth: Payer: Self-pay | Admitting: *Deleted

## 2014-01-01 NOTE — Telephone Encounter (Signed)
Pt.notified

## 2014-01-01 NOTE — Telephone Encounter (Signed)
FYIHerbert Seta: Heather returned your call after speaking with Dr. Orlie DakinFinnegan Marcelino Duster(Michelle is not available). They are going to see [name] to further evaluate & see if she needs further treatment. I have sent her last labs & office note (fax # [phone]) per Heather's request.

## 2014-01-01 NOTE — Telephone Encounter (Signed)
Notify Ms Elizabeth [name] that Dr Milinda CaveFinnegan's office will be calling her about a f/u - just to f/u on her hgb.  They may just continue to follow or may give her something to see if can increase it some.

## 2014-01-02 ENCOUNTER — Other Ambulatory Visit: Payer: Self-pay | Admitting: Internal Medicine

## 2014-01-02 DIAGNOSIS — D649 Anemia, unspecified: Secondary | ICD-10-CM

## 2014-01-02 NOTE — Progress Notes (Signed)
Order placed for f/u labs.  

## 2014-01-02 NOTE — Telephone Encounter (Signed)
Elizabeth [name] with Dr. Orlie DakinFinnegan office called to report that Elizabeth [name] realized that her Iron pill was not in her weekly pill box. So she was off of her Iron x 1 week & she has not been eating foods with iron. Pt would rather go back on her Iron & work on her diet first.

## 2014-01-02 NOTE — Telephone Encounter (Signed)
Ok.  Recheck cbc and ferritin in one month.

## 2014-01-03 NOTE — Telephone Encounter (Signed)
Pt already scheduled for lab appt on 02/13/14, added note to appt.

## 2014-02-06 ENCOUNTER — Emergency Department: Payer: Self-pay | Admitting: Emergency Medicine

## 2014-02-06 LAB — URINALYSIS, COMPLETE
Bacteria: NONE SEEN
Bilirubin,UR: NEGATIVE
GLUCOSE, UR: NEGATIVE mg/dL (ref 0–75)
Ketone: NEGATIVE
Leukocyte Esterase: NEGATIVE
NITRITE: NEGATIVE
Ph: 5 (ref 4.5–8.0)
Protein: NEGATIVE
RBC,UR: 1 /HPF (ref 0–5)
SPECIFIC GRAVITY: 1.005 (ref 1.003–1.030)
Squamous Epithelial: NONE SEEN

## 2014-02-06 LAB — COMPREHENSIVE METABOLIC PANEL
ALT: 19 U/L
Albumin: 3.3 g/dL — ABNORMAL LOW (ref 3.4–5.0)
Alkaline Phosphatase: 41 U/L — ABNORMAL LOW
Anion Gap: 5 — ABNORMAL LOW (ref 7–16)
BILIRUBIN TOTAL: 0.3 mg/dL (ref 0.2–1.0)
BUN: 25 mg/dL — ABNORMAL HIGH (ref 7–18)
CHLORIDE: 97 mmol/L — AB (ref 98–107)
CREATININE: 1.22 mg/dL (ref 0.60–1.30)
Calcium, Total: 9.2 mg/dL (ref 8.5–10.1)
Co2: 33 mmol/L — ABNORMAL HIGH (ref 21–32)
EGFR (African American): 46 — ABNORMAL LOW
GFR CALC NON AF AMER: 40 — AB
Glucose: 210 mg/dL — ABNORMAL HIGH (ref 65–99)
Osmolality: 281 (ref 275–301)
POTASSIUM: 4 mmol/L (ref 3.5–5.1)
SGOT(AST): 17 U/L (ref 15–37)
SODIUM: 135 mmol/L — AB (ref 136–145)
Total Protein: 6.5 g/dL (ref 6.4–8.2)

## 2014-02-06 LAB — CBC
HCT: 31.5 % — ABNORMAL LOW (ref 35.0–47.0)
HGB: 9.9 g/dL — AB (ref 12.0–16.0)
MCH: 28.3 pg (ref 26.0–34.0)
MCHC: 31.4 g/dL — ABNORMAL LOW (ref 32.0–36.0)
MCV: 90 fL (ref 80–100)
Platelet: 269 10*3/uL (ref 150–440)
RBC: 3.49 10*6/uL — ABNORMAL LOW (ref 3.80–5.20)
RDW: 14.9 % — ABNORMAL HIGH (ref 11.5–14.5)
WBC: 11.5 10*3/uL — ABNORMAL HIGH (ref 3.6–11.0)

## 2014-02-06 LAB — TROPONIN I: Troponin-I: <0.02 ng/mL

## 2014-02-06 LAB — PRO B NATRIURETIC PEPTIDE: B-Type Natriuretic Peptide: 925 pg/mL — ABNORMAL HIGH (ref 0–450)

## 2014-02-06 LAB — CK TOTAL AND CKMB (NOT AT ARMC)
CK, TOTAL: 72 U/L
CK-MB: 0.7 ng/mL (ref 0.5–3.6)

## 2014-02-11 LAB — CULTURE, BLOOD (SINGLE)

## 2014-02-13 ENCOUNTER — Other Ambulatory Visit: Payer: Medicare Other

## 2014-02-15 ENCOUNTER — Telehealth: Payer: Self-pay | Admitting: *Deleted

## 2014-02-15 ENCOUNTER — Other Ambulatory Visit (INDEPENDENT_AMBULATORY_CARE_PROVIDER_SITE_OTHER): Payer: Medicare Other

## 2014-02-15 DIAGNOSIS — D649 Anemia, unspecified: Secondary | ICD-10-CM

## 2014-02-15 DIAGNOSIS — E039 Hypothyroidism, unspecified: Secondary | ICD-10-CM

## 2014-02-15 LAB — CBC WITH DIFFERENTIAL/PLATELET
Basophils Absolute: 0.1 10*3/uL (ref 0.0–0.1)
Basophils Relative: 0.6 % (ref 0.0–3.0)
Eosinophils Absolute: 0.2 10*3/uL (ref 0.0–0.7)
Eosinophils Relative: 2.1 % (ref 0.0–5.0)
HCT: 30.1 % — ABNORMAL LOW (ref 36.0–46.0)
Hemoglobin: 9.6 g/dL — ABNORMAL LOW (ref 12.0–15.0)
Lymphocytes Relative: 11.8 % — ABNORMAL LOW (ref 12.0–46.0)
Lymphs Abs: 1.1 10*3/uL (ref 0.7–4.0)
MCHC: 31.9 g/dL (ref 30.0–36.0)
MCV: 89.8 fl (ref 78.0–100.0)
Monocytes Absolute: 0.5 10*3/uL (ref 0.1–1.0)
Monocytes Relative: 5.7 % (ref 3.0–12.0)
Neutro Abs: 7.7 10*3/uL (ref 1.4–7.7)
Neutrophils Relative %: 79.8 % — ABNORMAL HIGH (ref 43.0–77.0)
Platelets: 328 10*3/uL (ref 150.0–400.0)
RBC: 3.35 Mil/uL — ABNORMAL LOW (ref 3.87–5.11)
RDW: 15 % (ref 11.5–15.5)
WBC: 9.6 10*3/uL (ref 4.0–10.5)

## 2014-02-15 LAB — TSH: TSH: 0.96 u[IU]/mL (ref 0.35–4.50)

## 2014-02-15 LAB — FERRITIN: FERRITIN: 29 ng/mL (ref 10.0–291.0)

## 2014-02-15 NOTE — Telephone Encounter (Signed)
Spoke with patient's daughter, pt was seen in ED for ongoing cough x 2 weeks, had been Rx'd Levaquin by Dr. Meredeth Ide but cough wasn't resolving. States they were told all labs resulted there were normal, but had blood drawn in 4 tubes (blood cultures?) and were told they would take 3-4 days to result and to call back to get results. Unable to reach anyone by phone for results. Sent for ED records and all labs.

## 2014-02-15 NOTE — Telephone Encounter (Signed)
I just guessed they might be blood cultures, I requested all ED records. Pt's daughter did not know what test was drawn.

## 2014-02-15 NOTE — Telephone Encounter (Signed)
Notes in results folder.

## 2014-02-15 NOTE — Telephone Encounter (Signed)
Thanks.  Will you call [facility] lab [phone] I think) - and see if they have any preliminary results.

## 2014-02-15 NOTE — Telephone Encounter (Signed)
Pt.notified

## 2014-02-15 NOTE — Telephone Encounter (Signed)
Need more information.  Need labs.

## 2014-02-15 NOTE — Telephone Encounter (Signed)
The information we received did not have blood culture results.  I am not sure if done.  I don't see mention of pending, etc.

## 2014-02-15 NOTE — Telephone Encounter (Signed)
Called lab at [facility].  Blood cultures were negative.  Please notify.

## 2014-02-15 NOTE — Telephone Encounter (Signed)
Pt went to [facility] 02-06-2014, she had blood work done, they told her to call back [facility] didn't give them a name or anything, was wanting to know the results of the blood work done that day

## 2014-02-18 ENCOUNTER — Encounter: Payer: Self-pay | Admitting: *Deleted

## 2014-02-28 ENCOUNTER — Telehealth: Payer: Self-pay

## 2014-02-28 ENCOUNTER — Other Ambulatory Visit: Payer: Self-pay | Admitting: *Deleted

## 2014-02-28 MED ORDER — PANTOPRAZOLE SODIUM 40 MG PO TBEC: 40.0000 mg | DELAYED_RELEASE_TABLET | Freq: Every day | ORAL | Status: AC

## 2014-02-28 NOTE — Telephone Encounter (Signed)
Pt.notified

## 2014-02-28 NOTE — Telephone Encounter (Signed)
The patient's daughter Jasmine December(Sharon) called and is hoping to get a 90 day rx of protonix filled at [facility].   Daughter's callback - [phone]

## 2014-03-07 ENCOUNTER — Telehealth: Payer: Self-pay | Admitting: *Deleted

## 2014-03-07 ENCOUNTER — Ambulatory Visit (INDEPENDENT_AMBULATORY_CARE_PROVIDER_SITE_OTHER): Payer: Medicare Other | Admitting: Internal Medicine

## 2014-03-07 VITALS — BP 136/70 | HR 80 | Temp 99.2°F | Ht 64.25 in | Wt 201.0 lb

## 2014-03-07 DIAGNOSIS — R0602 Shortness of breath: Secondary | ICD-10-CM

## 2014-03-07 DIAGNOSIS — Z23 Encounter for immunization: Secondary | ICD-10-CM

## 2014-03-07 DIAGNOSIS — G4730 Sleep apnea, unspecified: Secondary | ICD-10-CM

## 2014-03-07 DIAGNOSIS — D649 Anemia, unspecified: Secondary | ICD-10-CM

## 2014-03-07 DIAGNOSIS — N289 Disorder of kidney and ureter, unspecified: Secondary | ICD-10-CM

## 2014-03-07 DIAGNOSIS — E1129 Type 2 diabetes mellitus with other diabetic kidney complication: Secondary | ICD-10-CM

## 2014-03-07 DIAGNOSIS — E039 Hypothyroidism, unspecified: Secondary | ICD-10-CM

## 2014-03-07 DIAGNOSIS — E785 Hyperlipidemia, unspecified: Secondary | ICD-10-CM

## 2014-03-07 DIAGNOSIS — I10 Essential (primary) hypertension: Secondary | ICD-10-CM

## 2014-03-07 DIAGNOSIS — J438 Other emphysema: Secondary | ICD-10-CM

## 2014-03-07 NOTE — Progress Notes (Signed)
Pre visit review using our clinic review tool, if applicable. No additional management support is needed unless otherwise documented below in the visit note. 

## 2014-03-07 NOTE — Telephone Encounter (Signed)
Pt was seen today & would like for you to call her & explain to her more about her "arteries". Pt is confused on what is going on & wants to know what could have caused this & how can it be improved. Please advise

## 2014-03-07 NOTE — Telephone Encounter (Signed)
Called pt and questions answered.  She has her cardiology appt scheduled.  Also has f/u with Dr Orlie DakinFinnegan tomorrow.

## 2014-03-08 ENCOUNTER — Ambulatory Visit: Payer: Self-pay | Admitting: Oncology

## 2014-03-08 ENCOUNTER — Other Ambulatory Visit: Payer: Self-pay | Admitting: *Deleted

## 2014-03-08 ENCOUNTER — Encounter: Payer: Self-pay | Admitting: Internal Medicine

## 2014-03-08 DIAGNOSIS — R0602 Shortness of breath: Secondary | ICD-10-CM | POA: Insufficient documentation

## 2014-03-08 MED ORDER — FENOFIBRATE 160 MG PO TABS: 160.0000 mg | ORAL_TABLET | Freq: Every day | ORAL | Status: AC

## 2014-03-08 NOTE — Assessment & Plan Note (Signed)
Unclear as to the exact etiology.  Probably multifactorial.  Has seen Dr Meredeth IdeFleming recently.  Has been on multiple abx.  No increased cough or congestion.  Using inhalers and nebs.  Keep f/u with Dr Meredeth IdeFleming.  Murmur appears to be more prominent.  Last ECHO that I have record - 05/2013.  Given the worsening sob and doe with more prominent murmur, I do feel further cardiac w/up warranted.  We discussed repeat ECHO, etc.  She declined repeat EKG today.  Has appt with Dr Darrold JunkerParaschos at the end of this month.  Will move appt to earlier date.  Also, has f/u with Dr Orlie DakinFinnegan tomorrow.  Recheck cbc.  Anemia could be contributing.

## 2014-03-08 NOTE — Assessment & Plan Note (Signed)
Blood pressure under good control.  Same meds.  Follow metabolic panel.   

## 2014-03-08 NOTE — Assessment & Plan Note (Signed)
With low hgb.  Had been relatively stable.  Slightly more decreased on last check.  Due to see Dr Orlie DakinFinnegan tomorrow.  May be contributing some to the worsening sob.  Hold labs today.  States he will check tomorrow.  Further treatment pending results.

## 2014-03-08 NOTE — Progress Notes (Signed)
Subjective:    Patient ID: Elizabeth [name], female    DOB: June 06, 1925, 80 y.o.   MRN: [id number]  HPI 80 year old female with past history of COPD on oxygen, diabetes, hypertension, hypercholesterolemia and hypothyroidism who comes in today for a scheduled follow up.  Accompanied by her daughter.  History obtained by both of them.  She was admitted 09/01/13 - 09/05/13 with sob and chest tightness.   Was found to have an exacerbation of CHF and COPD exacerbation.  Was given IV lasix and steroids.  She is living with her daughter.   States sugars are doing well.  Eating and drinking well.  Her main complaint is that of sob with exertion.  Has noticed some worsening over the last 2 months.  Has seen Dr Meredeth IdeFleming.  States has had three different abx over the last 6 weeks.  No increased cough or congestion.  Some wheezing.  Uses her neb.  Helps some. Has had anemia.  Seeing Dr Orlie DakinFinnegan.  Due to f/u tomorrow.  On iron qod.  No chest pain.  No nausea or vomiting reported.     Past Medical History  Diagnosis Date  . Diabetes mellitus     diabetic retinopathy  . Hypertension   . Hypercholesterolemia   . Hypothyroidism   . Depression   . History of colonic polyps   . COPD (chronic obstructive pulmonary disease)   . Anemia   . Sleep apnea   . Spinal stenosis   . Nephrolithiasis   . Macular degeneration     Current Outpatient Prescriptions on File Prior to Visit  Medication Sig Dispense Refill  . acetaminophen (TYLENOL) 650 MG CR tablet Take 1,300 mg by mouth every 8 (eight) hours as needed for pain.      Marland Kitchen. albuterol-ipratropium (COMBIVENT) 18-103 MCG/ACT inhaler Inhale 2 puffs into the lungs 2 (two) times daily.      Marland Kitchen. buPROPion (WELLBUTRIN) 100 MG tablet Take 100 mg by mouth 2 (two) times daily.      . Calcium Carb-Cholecalciferol (PRONUTRIENTS CALCIUM+D3 PO) Take by mouth.      . diltiazem (TIAZAC) 180 MG 24 hr capsule Take 180 mg by mouth daily.      Marland Kitchen. DOXEPIN HCL PO Take by mouth.      .  fenofibrate 160 MG tablet Take 1 tablet (160 mg total) by mouth daily.  90 tablet  1  . fluticasone (FLONASE) 50 MCG/ACT nasal spray Place 2 sprays into the nose daily.  48 g  2  . Fluticasone-Salmeterol (ADVAIR) 250-50 MCG/DOSE AEPB Inhale 1 puff into the lungs every 12 (twelve) hours.      . furosemide (LASIX) 20 MG tablet Take 20 mg by mouth daily.      Marland Kitchen. gabapentin (NEURONTIN) 100 MG capsule Take 1 capsule (100 mg total) by mouth daily.  90 capsule  1  . Glucose Blood (BAYER BREEZE 2 TEST) DISK Check blood sugars 3 times daily  100 each  5  . HYDROcodone-acetaminophen (NORCO/VICODIN) 5-325 MG per tablet Take 1 tablet by mouth every 4 (four) hours as needed.      . insulin aspart (NOVOLOG FLEXPEN) 100 UNIT/ML injection Inject into the skin 3 (three) times daily before meals. Inject 4 units at breakfast and 6 units at lunch and supper      . Insulin Pen Needle (B-D UF III MINI PEN NEEDLES) 31G X 5 MM MISC 1 each by Does not apply route 3 (three) times daily.      .Marland Kitchen  ipratropium (ATROVENT) 0.03 % nasal spray Place 2 sprays into both nostrils 2 (two) times daily.  30 mL  1  . levothyroxine (SYNTHROID, LEVOTHROID) 50 MCG tablet Take 1 tablet (50 mcg total) by mouth daily.  90 tablet  1  . lisinopril (PRINIVIL,ZESTRIL) 40 MG tablet Take 1 tablet (40 mg total) by mouth daily.  90 tablet  3  . nystatin (MYCOSTATIN) 100000 UNIT/ML suspension 10cc's by mouth (swish & spit) TID  60 mL  0  . pantoprazole (PROTONIX) 40 MG tablet Take 1 tablet (40 mg total) by mouth daily.  90 tablet  1  . Psyllium (METAMUCIL PO) Take by mouth.      . tiotropium (SPIRIVA) 18 MCG inhalation capsule Place 18 mcg into inhaler and inhale at bedtime.      Marland Kitchen. venlafaxine XR (EFFEXOR-XR) 75 MG 24 hr capsule Take 75 mg by mouth daily.       No current facility-administered medications on file prior to visit.    Review of Systems Patient denies any headache, lightheadedness or dizziness.  No sinus or allergy symptoms.  No chest  pain, tightness or palpitations.  No increased chest congestion.  No cough.  Breathing better.   No nausea or vomiting.  No acid reflux.  No abdominal pain or cramping.  No bowel change, such as diarrhea, constipation, BRBPR or melana.  No urine change.  Seeing Dr Fransico MichaelBrennan for her macular degeneration.  Sugars as outlined.  Feels better.        Objective:   Physical Exam  Filed Vitals:   03/07/14 1000  BP: 136/70  Pulse: 80  Temp: 99.2 F (1737.973 C)   80 year old female in no acute distress.   HEENT:  Nares- clear.  Oropharynx - without lesions. NECK:  Supple.  Nontender.  No audible bruit.  HEART:  Appears to be regular. II/VI systolic murmur.  LUNGS:  No crackles or significant wheezing audible.  Respirations even and unlabored.  RADIAL PULSE:  Equal bilaterally.  BREASTS:  No nipple discharge or nipple retraction present.  Well healed incision site.  Some scar tissue noted at the incision site.   ABDOMEN:  Soft, nontender.  Bowel sounds present and normal.  No audible abdominal bruit.    EXTREMITIES:  No increased edema present.  DP pulses palpable and equal bilaterally.      FEET:  Without lesions.        Assessment & Plan:  GI.  Colonoscopy 07/28/10 with tubular adenomatous polyps in the cecum.  Had recommended follow up colonoscopy in three years.  With recent significant anemia.  Evaluated by GI in the hospital.  They had recommended a f/u colonoscopy as an outpatient.  She declined.  Desires not to pursue colonoscopy.   No bleeding noted per her report.  Follow.   ABNORMAL MAMMOGRAM.  Had a mammogram 2/13 - BiRADS IV.  Follow up mammogram 8/13.  Seeing Dr Katrinka BlazingSmith.  Biopsy - infiltrating mammary carcinoma of the right breast.  S/p surgery.  Continues to follow up with oncology.  States up to date with mammogram.  Need results.    HEALTH MAINTENANCE.  Physical 05/15/13.  She is s/p hysterectomy.  Colonoscopy as outlined.  Mammograms followed by oncology.   I spent 25 minutes with the  patient and more than 50% of the time was spent in consultation regarding the above.

## 2014-03-08 NOTE — Assessment & Plan Note (Signed)
Continue CPAP.  

## 2014-03-08 NOTE — Assessment & Plan Note (Signed)
Low cholesterol diet and exercise.  She is off the Zetia.  Follow lipid panel.

## 2014-03-08 NOTE — Assessment & Plan Note (Signed)
On thyroid replacement.  Follow tsh.  

## 2014-03-08 NOTE — Assessment & Plan Note (Signed)
Has been seeing Dr Meredeth IdeFleming.  Using inhalers and neb as needed.  Has been on multiple abx recently.  Unclear if her sob is related to her underlying pulmonary issues.  No increased cough or congestion now.  Continue oxygen.  Pursue cardiology evaluation.  Continues to f/u with Dr Meredeth IdeFleming.

## 2014-03-08 NOTE — Assessment & Plan Note (Signed)
Up to date with eye exams.  Last urine microalbumin/cr ratio wnl.  Follow met b and a1c. Sugars doing better.  Last a1c 6.9.

## 2014-03-08 NOTE — Assessment & Plan Note (Signed)
Follow renal function.  Last creatinine improved.

## 2014-03-31 ENCOUNTER — Ambulatory Visit: Payer: Self-pay | Admitting: Oncology

## 2014-04-04 ENCOUNTER — Telehealth: Payer: Self-pay | Admitting: *Deleted

## 2014-04-04 NOTE — Telephone Encounter (Signed)
Pt's daughter notified to contact previous psychiatrist's office for refills first.  verbalized understanding. Recommended call back if any problems.

## 2014-04-04 NOTE — Telephone Encounter (Signed)
Pt's daughter called, stating she is completely out of her Lorazepam 0.5 mg tid prn. Previously filled by Dr. Scherrie Batemanavid Ward. States "he has left and no one knows anything". She has not contacted the practice regarding refills, doesn't believe they will refill. New psychiatrist appointment isn't until January. States she ran out yesterday and is having panic attacks. Uses [facility]. States no refills available at the pharmacy

## 2014-04-05 ENCOUNTER — Telehealth: Payer: Self-pay | Admitting: Internal Medicine

## 2014-04-05 ENCOUNTER — Other Ambulatory Visit: Payer: Self-pay | Admitting: Internal Medicine

## 2014-04-05 NOTE — Telephone Encounter (Signed)
Please advise 

## 2014-04-05 NOTE — Telephone Encounter (Signed)
I spoke to Dr Jolyn NapWard's office. They informed me that she has refills at the pharmacy.  She will need to establish with a physician at [facility] to continue to get the lorazepam.  They can go in M-Friday from 9-4:00 for evaluation and to get established.

## 2014-04-05 NOTE — Telephone Encounter (Signed)
Spoke with pts daughter Jasmine DecemberSharon.  She states there is a Dr A at [facility], Dr [facility], that can see her mother however her mother has no transportation there as [place] just had a double mastectomy.  Jasmine DecemberSharon further states her mother's Rx was 0.5mg  TID.  "Tell Dr Lorin PicketScott to just fill the medication for 3 months until my mother can see her new Dr on 1.11.15."

## 2014-04-05 NOTE — Telephone Encounter (Signed)
How often does she take the lorazepam?  I can call in a few to help with the more acute situation, but she is going to have to get established with another psychiatrist to have this refills.  Also, see if you can get the name and number of the person she has been talking to at Dr Jolyn NapWard's office.

## 2014-04-05 NOTE — Telephone Encounter (Signed)
Elizabeth [name], the pt's daughter called back in regarding to her mother needing a refill. Dr. Elesa MassedWard (the psychiatrist the pt used to see) is unable to refill the Rx due to the pt having Medicare ins only. Elizabeth [name] said Lurena JoinerRebecca told her to call back if there were problems. Elizabeth [name] is wondering if Dr. Lorin PicketScott can send a refill to the pt's pharmacy ASAP due to the pt having horrible panic attacks that are getting worse. The pharmacy closes at 6pm and they'll have to deliver the medication to the pt. Please call Elizabeth [name] to let her know if a refill will be able to be sent to the pharmacy. Sharon's ph# [phone] Thank you.

## 2014-04-05 NOTE — Telephone Encounter (Signed)
Spoke with Jasmine DecemberSharon, advised of MDs message.

## 2014-04-10 ENCOUNTER — Inpatient Hospital Stay: Payer: Self-pay | Admitting: Internal Medicine

## 2014-04-10 ENCOUNTER — Telehealth: Payer: Self-pay | Admitting: Internal Medicine

## 2014-04-10 LAB — URINALYSIS, COMPLETE
Bilirubin,UR: NEGATIVE
Blood: NEGATIVE
GLUCOSE, UR: NEGATIVE mg/dL (ref 0–75)
Leukocyte Esterase: NEGATIVE
Nitrite: NEGATIVE
Ph: 5 (ref 4.5–8.0)
Protein: NEGATIVE
RBC,UR: 1 /HPF (ref 0–5)
SPECIFIC GRAVITY: 1.018 (ref 1.003–1.030)
Squamous Epithelial: <1

## 2014-04-10 LAB — TROPONIN I: Troponin-I: <0.02 ng/mL

## 2014-04-10 LAB — CBC
HCT: 28.8 % — AB (ref 35.0–47.0)
HGB: 9 g/dL — AB (ref 12.0–16.0)
MCH: 27.8 pg (ref 26.0–34.0)
MCHC: 31.3 g/dL — ABNORMAL LOW (ref 32.0–36.0)
MCV: 89 fL (ref 80–100)
Platelet: 338 10*3/uL (ref 150–440)
RBC: 3.25 10*6/uL — ABNORMAL LOW (ref 3.80–5.20)
RDW: 14.5 % (ref 11.5–14.5)
WBC: 9 10*3/uL (ref 3.6–11.0)

## 2014-04-10 LAB — BASIC METABOLIC PANEL
Anion Gap: 5 — ABNORMAL LOW (ref 7–16)
BUN: 20 mg/dL — ABNORMAL HIGH (ref 7–18)
Calcium, Total: 8.6 mg/dL (ref 8.5–10.1)
Chloride: 98 mmol/L (ref 98–107)
Co2: 37 mmol/L — ABNORMAL HIGH (ref 21–32)
Creatinine: 0.91 mg/dL (ref 0.60–1.30)
EGFR (African American): >60
EGFR (Non-African Amer.): >60
Glucose: 136 mg/dL — ABNORMAL HIGH (ref 65–99)
Osmolality: 284 (ref 275–301)
Potassium: 4.2 mmol/L (ref 3.5–5.1)
Sodium: 140 mmol/L (ref 136–145)

## 2014-04-10 LAB — PRO B NATRIURETIC PEPTIDE: B-Type Natriuretic Peptide: 1453 pg/mL — ABNORMAL HIGH (ref 0–450)

## 2014-04-10 LAB — CK TOTAL AND CKMB (NOT AT ARMC)
CK, TOTAL: 93 U/L
CK-MB: 0.9 ng/mL (ref 0.5–3.6)

## 2014-04-10 NOTE — Telephone Encounter (Signed)
FYI patient advised to call 911 by CAN.

## 2014-04-10 NOTE — Telephone Encounter (Signed)
Patient Information:  Caller Name: Jasmine DecemberSharon  Phone: [phone]  Patient: Elizabeth PurserBunn, Toshiko J  Gender: Female  DOB: 03/24/1926  Age: 89 Years  PCP: Dale DurhamScott, Charlene  Office Follow Up:  Does the office need to follow up with this patient?: No  Instructions For The Office: N/A  RN Note:  FBS 144 04/10/14.  Sitting at table at time of call leaning against table.  Able to raise both arms and legs and smile.  Wheezing present. Can stand but very unsteady on feet.  Pulse Ox 94%  Pulse 85 bpm. Wants to "sleep all the time" due to "feels bad in her head."   Mouth is dry.  Last void this morning at 0900. Advised to call 911 now for severe weakness.  Verbalized undestanding.  Symptoms  Reason For Call & Symptoms: Malaise since 04/06/14 with weakness and  dizziness. Head feels "funny."  Feels weak all over.  Ran into the door this morning . "My eyes feel funny."  Near syncope episode 04/09/14.  Reviewed Health History In EMR: Yes  Reviewed Medications In EMR: Yes  Reviewed Allergies In EMR: Yes  Reviewed Surgeries / Procedures: Yes  Date of Onset of Symptoms: 04/06/2014  Guideline(s) Used:  Weakness (Generalized) and Fatigue  Disposition Per Guideline:   Call EMS 911 Now  Reason For Disposition Reached:   Severe weakness (i.e., unable to walk or barely able to walk, requires support) and new onset or worsening  Advice Given:  Reassurance  Weakness often accompanies viral illnesses (e.g., colds and flu).  Call Back If:  You become worse.  Patient Will Follow Care Advice:  YES

## 2014-04-10 NOTE — Telephone Encounter (Signed)
Please call later and confirm pt called 911.   Thanks.

## 2014-04-11 NOTE — Telephone Encounter (Signed)
Pt was admitted to the hospital

## 2014-04-11 NOTE — Telephone Encounter (Signed)
Tried calling to check on patient. No answer and no option to leave voicemail. Will try again later

## 2014-04-13 LAB — BASIC METABOLIC PANEL
Anion Gap: 3 — ABNORMAL LOW (ref 7–16)
BUN: 21 mg/dL — ABNORMAL HIGH (ref 7–18)
CALCIUM: 9 mg/dL (ref 8.5–10.1)
CHLORIDE: 88 mmol/L — AB (ref 98–107)
CREATININE: 1.07 mg/dL (ref 0.60–1.30)
Co2: 44 mmol/L (ref 21–32)
EGFR (African American): >60
EGFR (Non-African Amer.): 52 — ABNORMAL LOW
Glucose: 154 mg/dL — ABNORMAL HIGH (ref 65–99)
Osmolality: 276 (ref 275–301)
POTASSIUM: 3.6 mmol/L (ref 3.5–5.1)
Sodium: 135 mmol/L — ABNORMAL LOW (ref 136–145)

## 2014-04-14 LAB — BASIC METABOLIC PANEL
Anion Gap: 9 (ref 7–16)
BUN: 27 mg/dL — ABNORMAL HIGH (ref 7–18)
Calcium, Total: 9.1 mg/dL (ref 8.5–10.1)
Chloride: 86 mmol/L — ABNORMAL LOW (ref 98–107)
Co2: 39 mmol/L — ABNORMAL HIGH (ref 21–32)
Creatinine: 1.13 mg/dL (ref 0.60–1.30)
EGFR (African American): 59 — ABNORMAL LOW
GFR CALC NON AF AMER: 49 — AB
Glucose: 212 mg/dL — ABNORMAL HIGH (ref 65–99)
OSMOLALITY: 280 (ref 275–301)
Potassium: 3.8 mmol/L (ref 3.5–5.1)
Sodium: 134 mmol/L — ABNORMAL LOW (ref 136–145)

## 2014-04-24 ENCOUNTER — Telehealth: Payer: Self-pay | Admitting: Internal Medicine

## 2014-04-24 NOTE — Telephone Encounter (Signed)
Called and spoke to daughter (sharon).

## 2014-04-24 NOTE — Telephone Encounter (Signed)
Pt daughter called to state the pt passed away yesterday/msn

## 2014-04-24 NOTE — Telephone Encounter (Signed)
FYI

## 2014-04-30 DEATH — deceased

## 2014-06-12 ENCOUNTER — Encounter: Payer: Medicare Other | Admitting: Internal Medicine

## 2014-08-01 IMAGING — CR RIGHT FOOT COMPLETE - 3+ VIEW
1 series · 3 of 3 positions shown · non-contrast
Comparison: none

REASON FOR EXAM: Fall pain feet
COMMENTS:

PROCEDURE:     MDR - MDR FOOT RT COMP W/OBLIQUES  - March 06, 2012  [DATE]
RESULT:     Images of the right foot show degenerative changes in the first
metatarsal phalangeal joint. Images do not show definite fracture,
dislocation or bony destruction.

[Series 1: ap · 0.17mm/px · 3 of 3 slices shown]
[im 1/3]
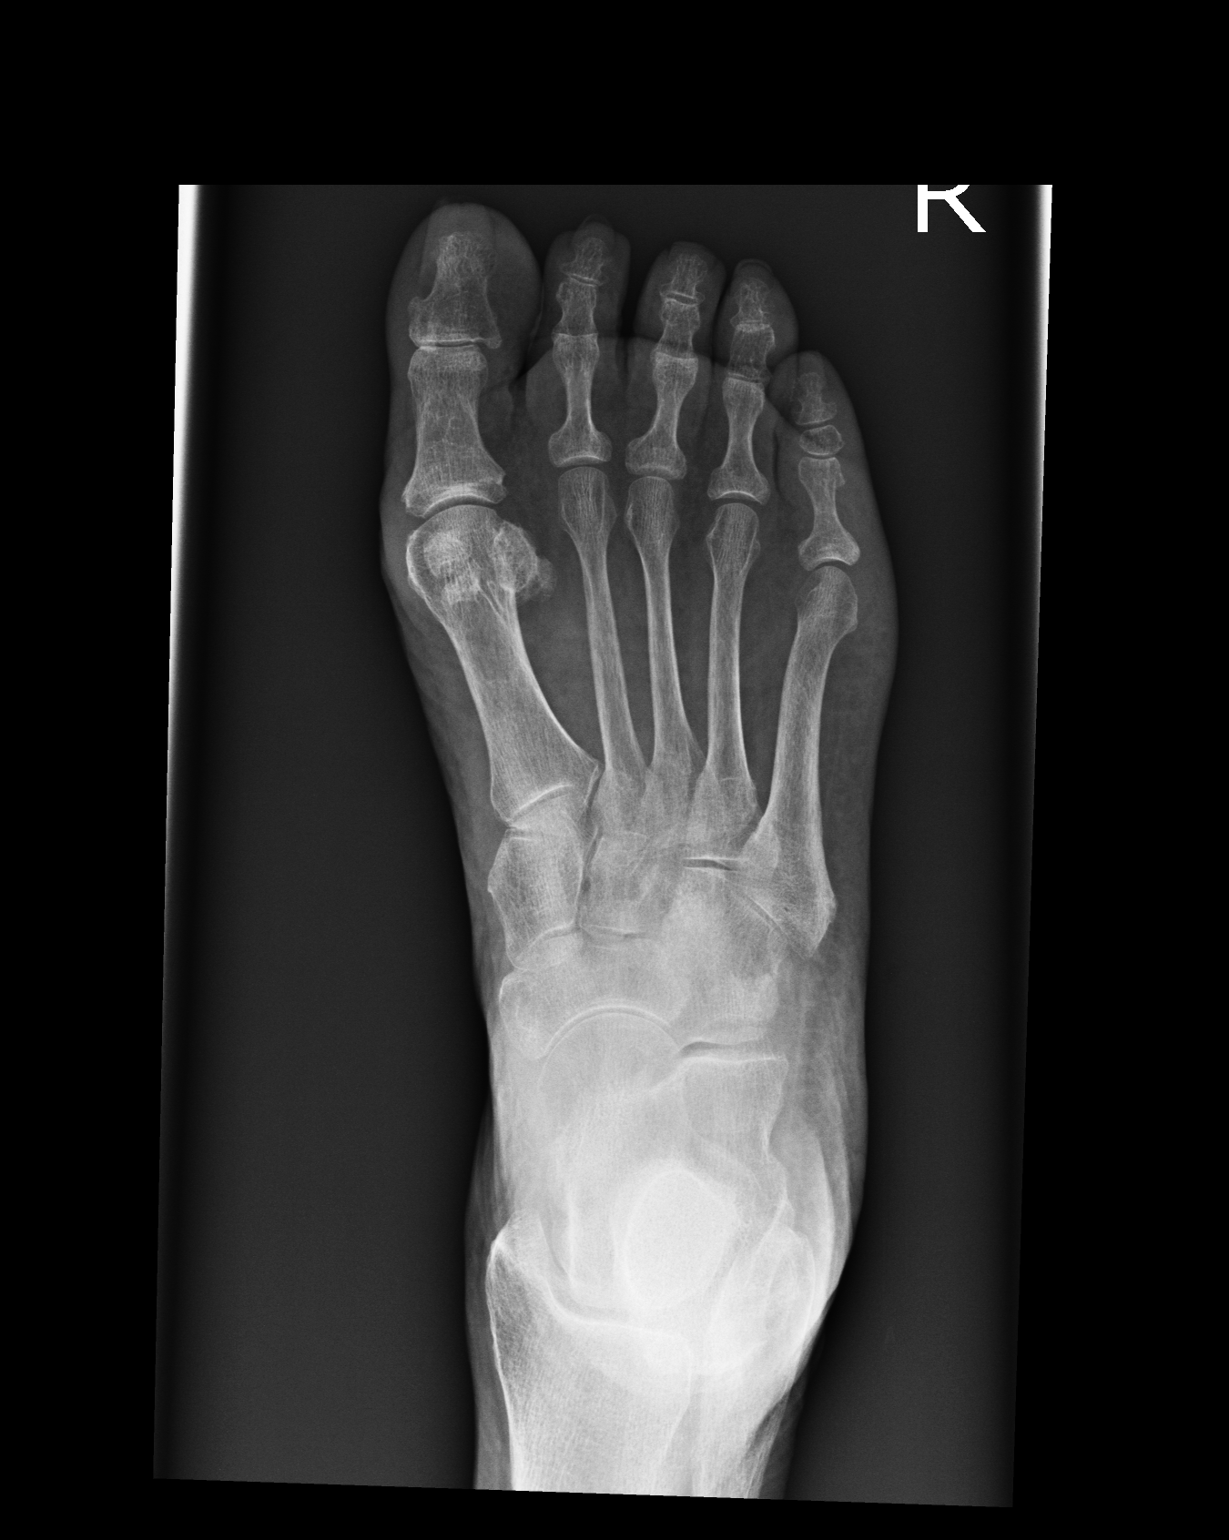
[im 2/3]
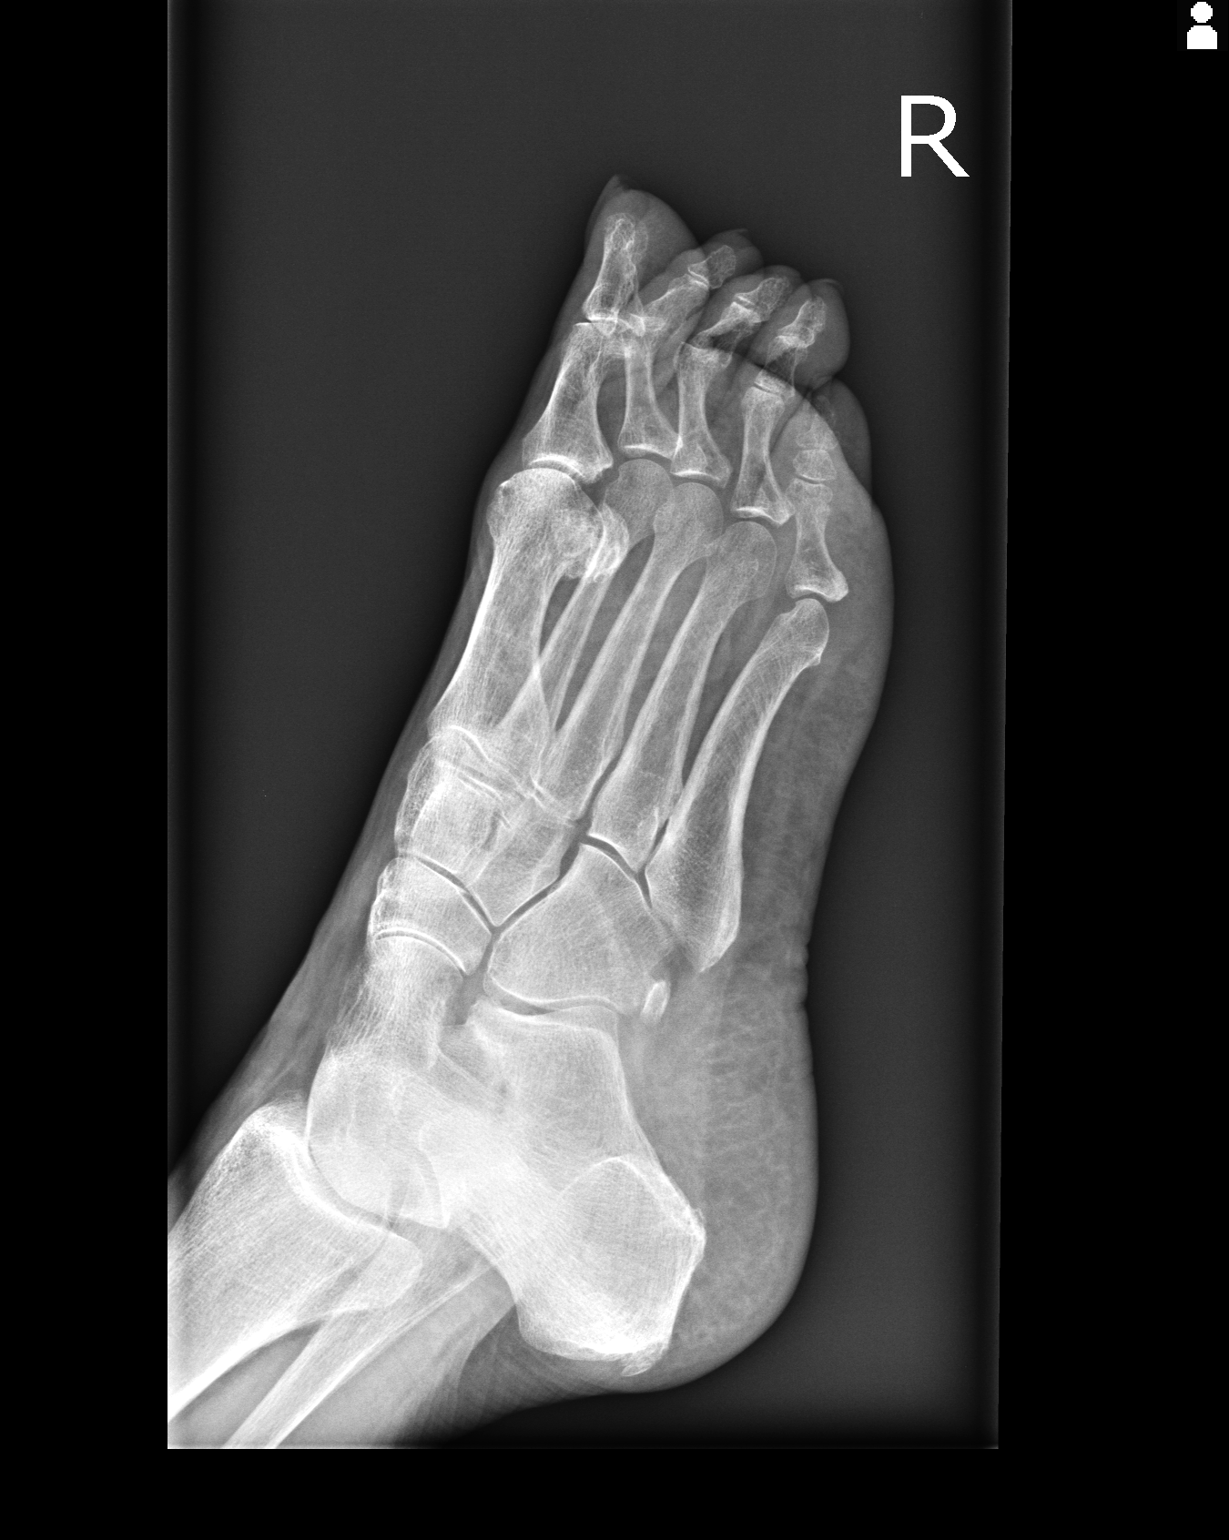
[im 3/3]
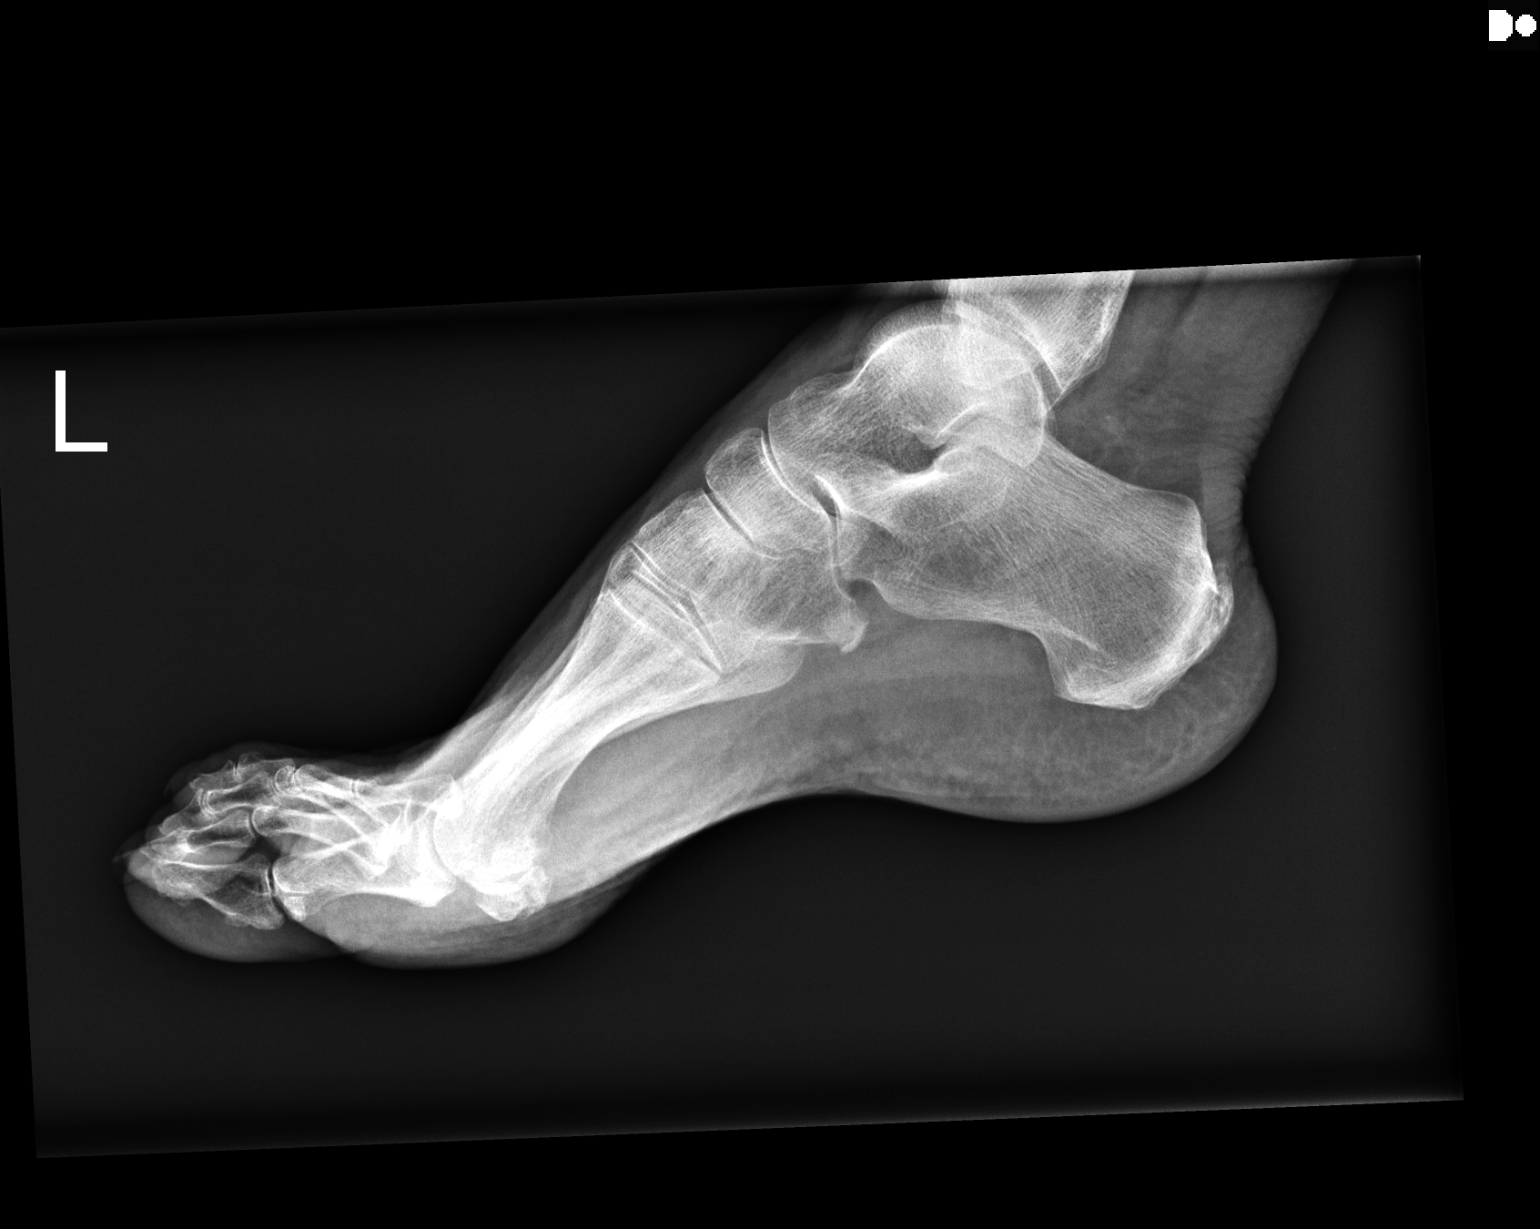

[3 of 3 positions shown; findings below may reference images not displayed]

IMPRESSION: 1. No acute bony abnormality evident in the right foot. Bony degenerative
changes are present.

[REDACTED]

## 2014-08-01 IMAGING — CR DG FOOT COMPLETE 3+V*L*
1 series · 3 of 3 positions shown · non-contrast
Comparison: none

REASON FOR EXAM: Fall pain feet
COMMENTS:

PROCEDURE:     MDR - MDR FOOT LT COMP W/OBLQUES  - March 06, 2012  [DATE]
RESULT:
Left foot images demonstrate degenerative narrowing in the first
metatarsophalangeal and interphalangeal joints. No fracture or bony
destruction is evident. Spurring is seen in the Achilles region of the
calcaneus.

[Series 1: ap · 0.17mm/px · 3 of 3 slices shown]
[im 1/3]
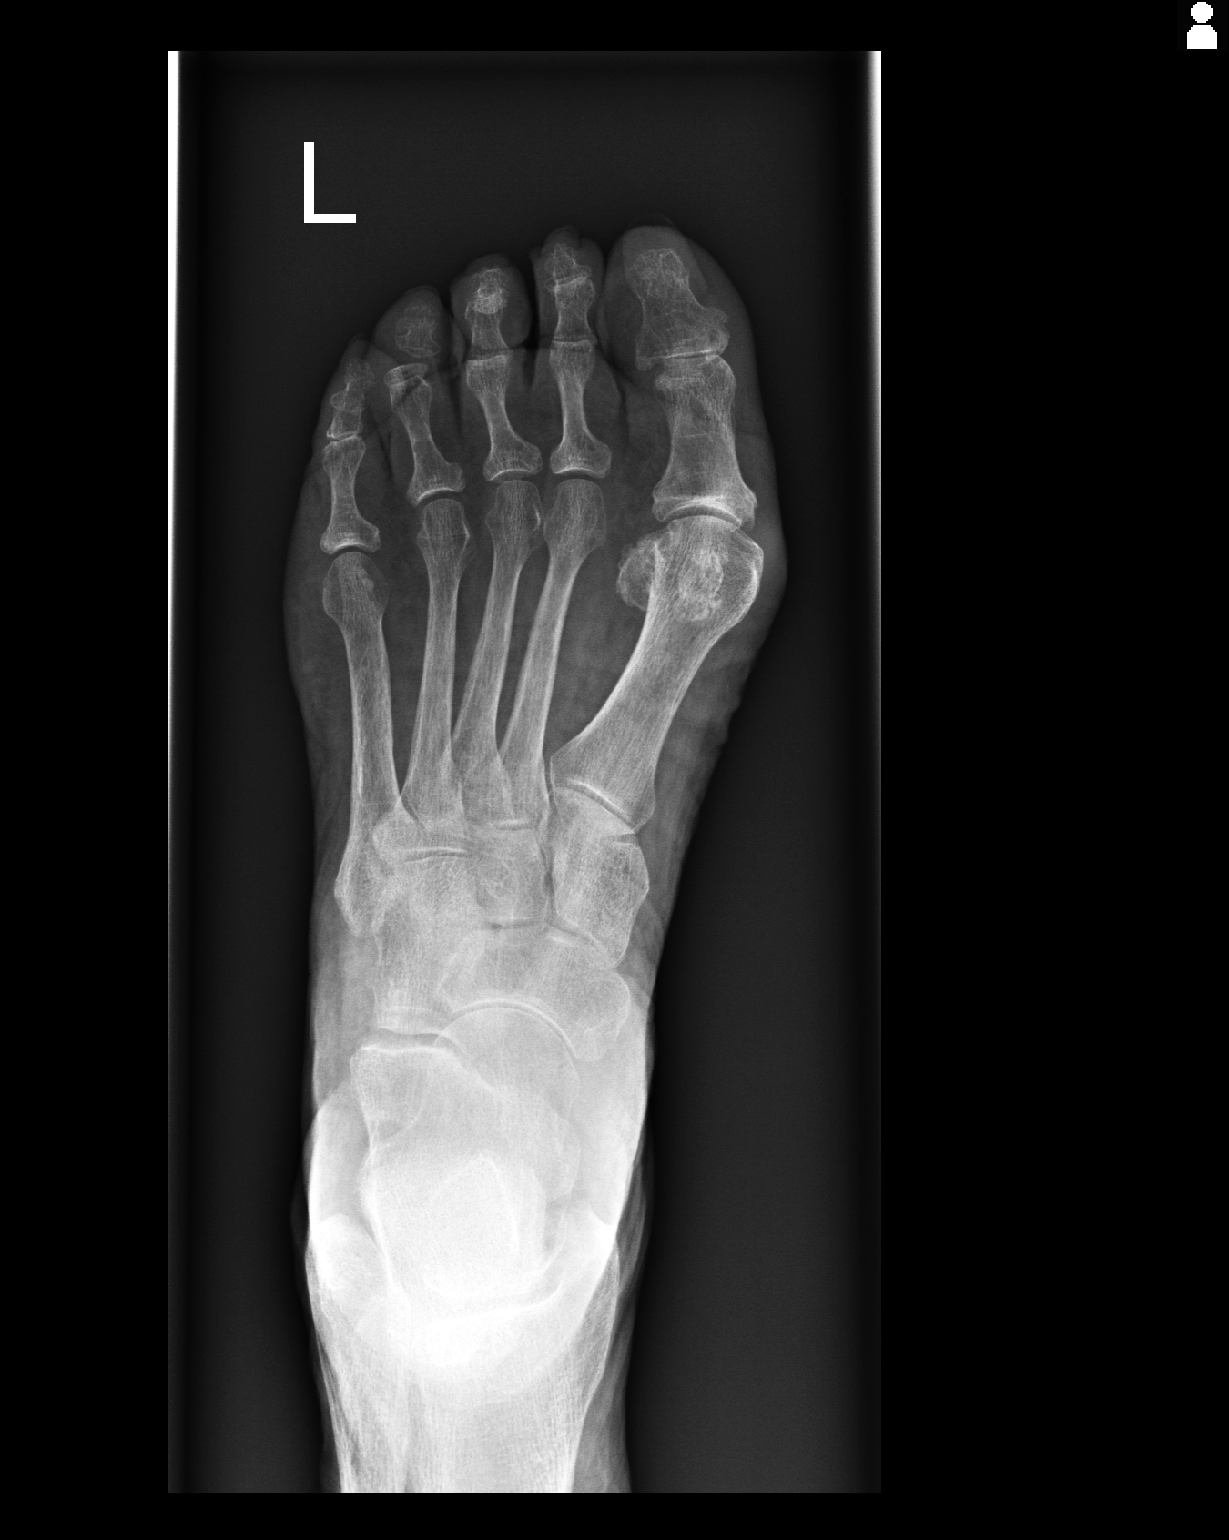
[im 2/3]
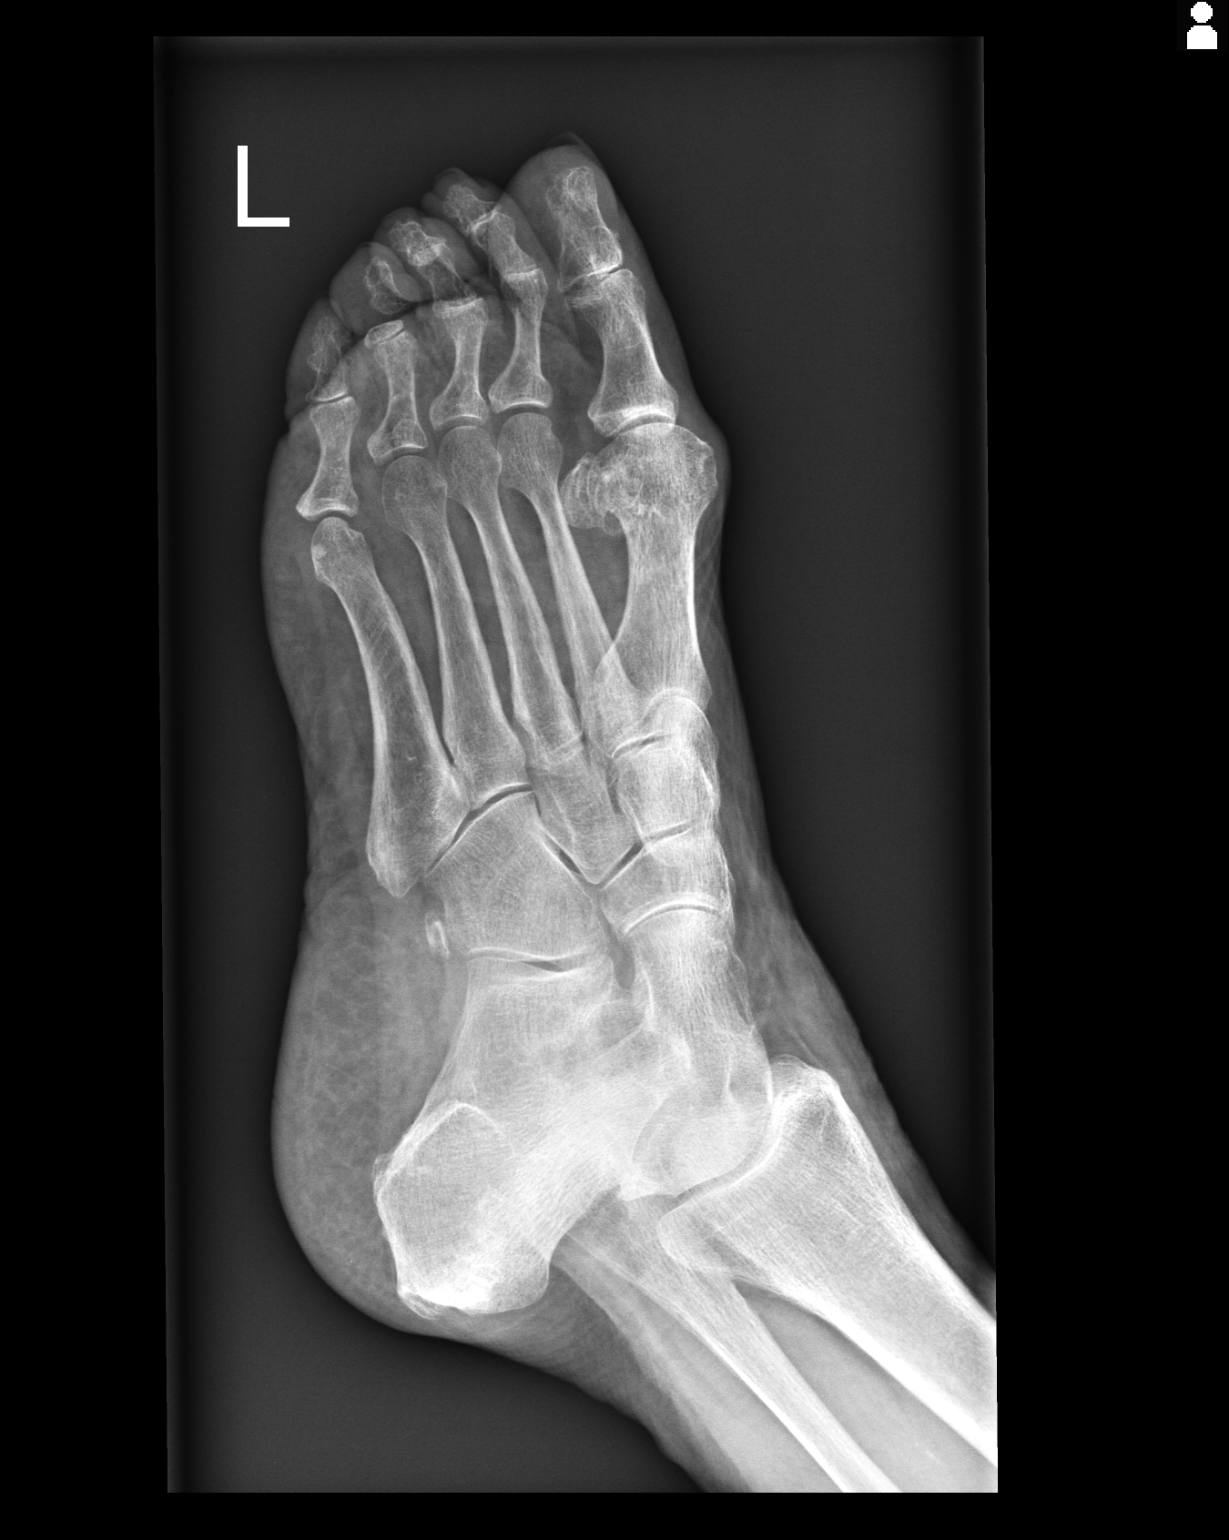
[im 3/3]
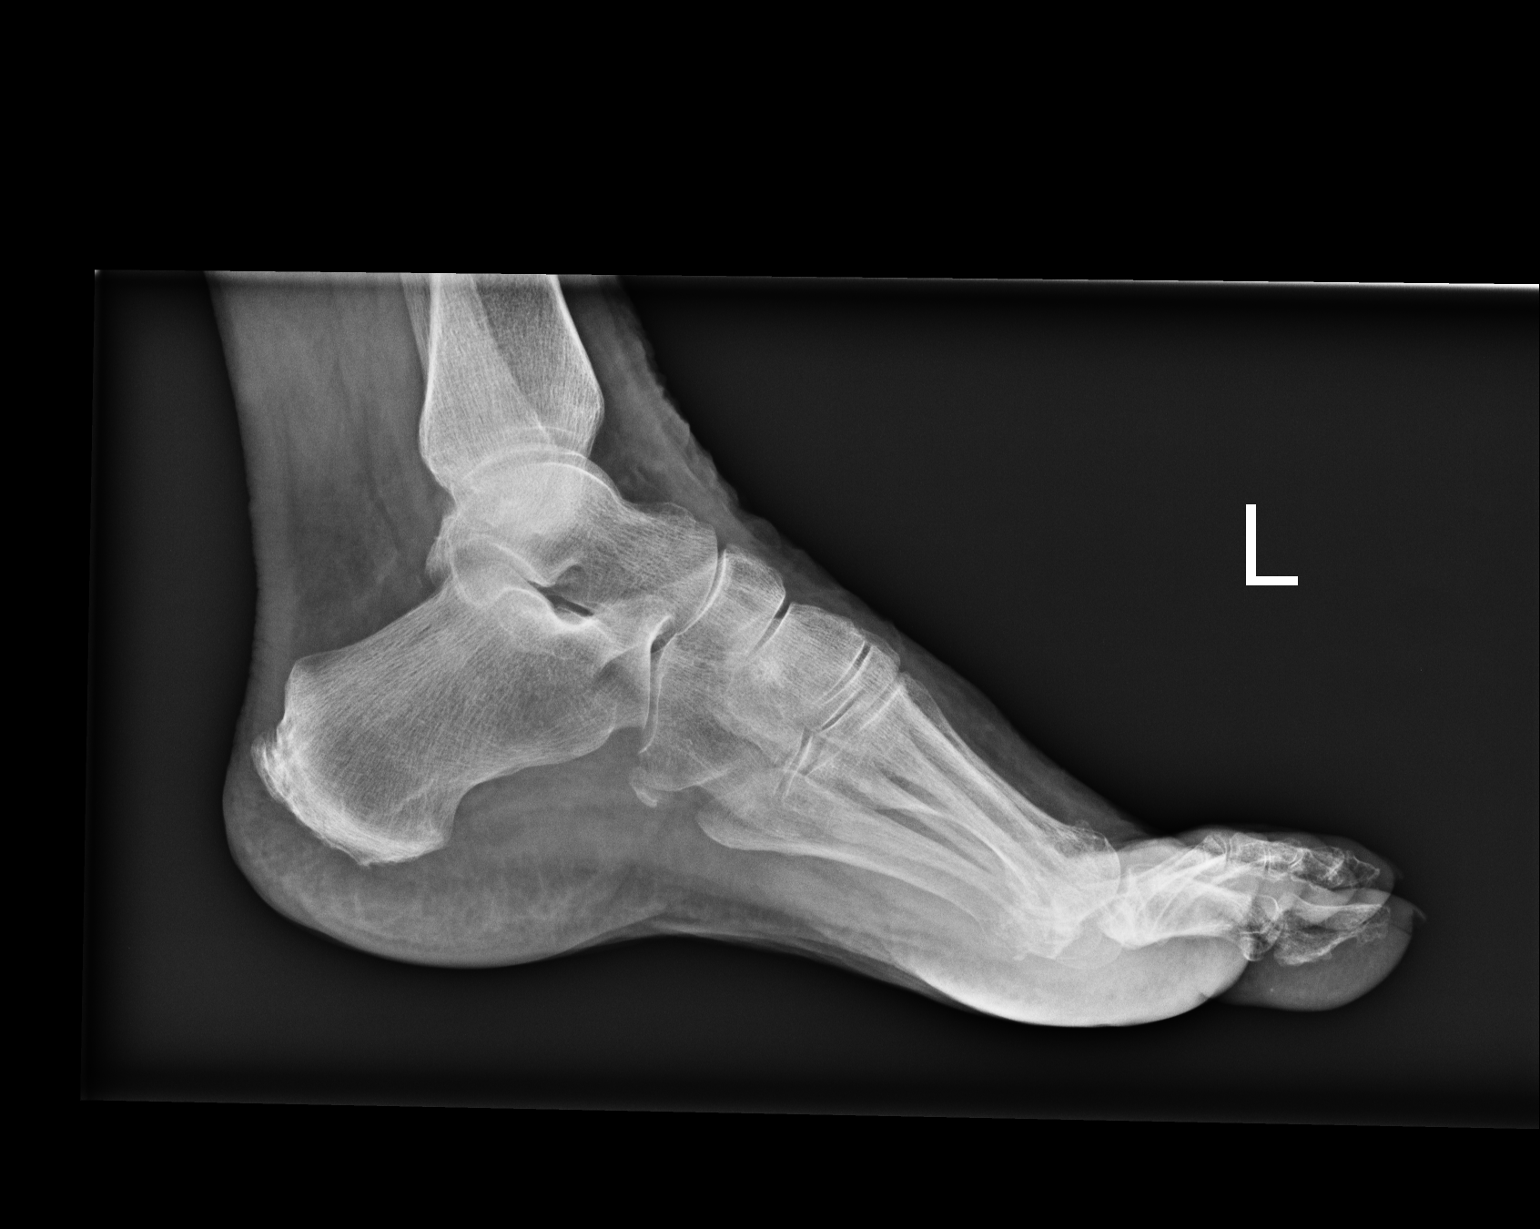

[3 of 3 positions shown; findings below may reference images not displayed]

IMPRESSION: 1.     No definite fracture.
2.     Degenerative changes are present.

[REDACTED]

## 2014-09-17 NOTE — Op Note (Signed)
PATIENT NAME:  Elizabeth [name], Elizabeth [name] MR#:  [id number] DATE OF BIRTH:  Sep 09, 1925  DATE OF PROCEDURE:  05/11/2012  PREOPERATIVE DIAGNOSIS: Carcinoma of the right breast, papillary neoplasm right breast.   POSTOPERATIVE DIAGNOSIS: Carcinoma of the right breast, papillary neoplasm right breast.   PROCEDURE: Right partial mastectomy.   SURGEON:  Renda RollsWilton Smith, MD  ANESTHESIA: Local 1% Xylocaine with 0.5% Sensorcaine with epinephrine and monitored anesthesia care.   INDICATIONS: This 81 year old female had recent mammogram findings of nodules in the upper aspect of the right breast. She had needle biopsy demonstrating infiltrating mammary carcinoma at the 1 o'clock position and a papillary lesion at the 12 o'clock position. She had preoperative ultrasound-guided insertion of Kopan wires to mark both lesions. These ultrasound images were reviewed prior to surgery.   DESCRIPTION OF PROCEDURE: The patient was placed on the operating table in the supine position. Her right arm was placed on a lateral arm rest. The dressing was removed from the right breast, exposing two Kopans wires in the upper aspect of the breast. Each were cut 3 cm from the skin. The right breast and surrounding chest wall were prepared with ChloraPrep and draped in a sterile manner.   A transversely oriented curvilinear incision was made from approximately the 10 o'clock to the 2 o'clock position overlying the cancer with a width of skin of approximately 1 cm removed. Dissection was carried down through subcutaneous tissues and encountered the wire, which marked the lesion at the one o'clock position, and dissected down around this wire. There was a  nodule encountered in the upper aspect of the specimen at the deep margin that was somewhat close to the lesion. The excision was carried around the second wire which marked the lesion at the 12 o'clock position. The specimen was marked with margin maps using silk sutures to hold these markers in  place, tagging the medial, lateral, cranial, caudal, and deep margins. The 2 o'clock end of the skin ellipse was tagged with a silk stitch.  I re-excised the deep margin which was adjacent to the visible nodule, which removed a portion of tissue which was approximately 4 x 4 x 1 cm in dimension, and tagged the new margin with a stitch and placed it adjacent to Telfa and submitted all for pathology.   The wound was inspected. Several small bleeding points were cauterized. The deeper tissues and subcutaneous tissues were infiltrated with 0.5% Sensorcaine. The subcutaneous tissues were closed with interrupted 4-0 chromic and the skin was closed with a running 4-0 Monocryl subcuticular suture.   The pathologist called back with the report and I discussed the re-excision of the deep margin and so with this it appeared that the margins were clear and tissue submitted for routine studies.   The wound was then dressed with Dermabond. The patient appeared to tolerate the procedure satisfactorily and was carried to the recovery room for postoperative care.   ____________________________ [name]. Renda RollsWilton Smith, MD jws:bjt D: 05/11/2012 11:53:21 ET T: 05/11/2012 12:04:22 ET JOB#: [id number]  cc: Adella HareJ. Wilton Smith, MD, <Dictator> Adella HareWILTON [name] SMITH MD ELECTRONICALLY SIGNED 05/11/2012 19:18

## 2014-09-20 NOTE — Consult Note (Signed)
Brief Consult Note: Diagnosis: Borderline elevated troponin, no CP, very likely demand supply without MI. SOB, probable COPD with element CHF, normal LV function by echo.   Patient was seen by consultant.   Consult note dictated.   Comments: REC  Agree with current therpay, defer full dose anticoagulation, defer further cardiac eval at this time in setting of anemia, probable GIB.  Electronic Signatures: Marcina MillardParaschos, Eliora Nienhuis (MD)  (Signed 08-Jul-14 07:20)  Authored: Brief Consult Note   Last Updated: 08-Jul-14 07:20 by Marcina MillardParaschos, Gerritt Galentine (MD)

## 2014-09-20 NOTE — Consult Note (Signed)
Brief Consult Note: Diagnosis: CHF exacerbation, SOB, COPD, Anemia.   Patient was seen by consultant.   Orders entered.   Discussed with Attending MD.   Comments: Patient seen and evaluated in the ER. She presents with SOB and weakness. CXR and BNP suggestive of CHF exacerbation. She also has COPD and is on home 3L O2. Hgb was noted to be low, 7.7, when prior draw a few months ago was greater than 11. Iron studies and ferritin ordered and reviewed, serum iron low, otherwise unremarkable. Anemia is normocytic. Last colonoscopy was 2012 notable for diverticulosis and a single tubular adenoma 3mm. Echo pending. Heme + in the ER. Recommend stabilizing the patients cardiopulmonary status, checking serial h&h and transfuse as necessary. Will monitor patients hgb while cardiopulmonary status is being addressed. Will consider endoscopic intervention pending hgb trend and per clinical course. Will follow closely. Full consult being dictated.  Electronic Signatures: Ashok Cordiaarle, Toryn Dewalt M (PA-C)  (Signed 07-Jul-14 16:21)  Authored: Brief Consult Note   Last Updated: 07-Jul-14 16:21 by Ashok CordiaEarle, Sinclair Arrazola M (PA-C)

## 2014-09-20 NOTE — Consult Note (Signed)
Chief Complaint:  Subjective/Chief Complaint seen for anemia and heme positive stool.  denies n/v or abdominal pain.  on further discusseion with patient she has been seeing some blood on toilet paper when she wipes but is unsure as to whether it is from urine or stool.   VITAL SIGNS/ANCILLARY NOTES: **Vital Signs.:   08-Jul-14 16:13  Vital Signs Type Routine  Temperature Temperature (F) 98.1  Celsius 36.7  Temperature Source oral  Pulse Pulse 86  Respirations Respirations 20  Systolic BP Systolic BP 841  Diastolic BP (mmHg) Diastolic BP (mmHg) 51  Mean BP 72  Pulse Ox % Pulse Ox % 94  Pulse Ox Activity Level  At rest  Oxygen Delivery 4L  *Intake and Output.:   08-Jul-14 02:49  Stool  moderate brown formed   Brief Assessment:  Cardiac Regular   Respiratory poor lung sounds.   Gastrointestinal details normal Soft  Nontender  Nondistended  Bowel sounds normal   Lab Results: Routine Chem:  08-Jul-14 04:18   Phosphorus, Serum 4.8 (Result(s) reported on 05 Dec 2012 at 03:03PM.)    04:19   Result Comment TROPONIN - RESULTS VERIFIED BY REPEAT TESTING.  - RESULT CALLED PREVIOUSLY 12/04/12 AT 1300  - BY [facility]  Result(s) reported on 05 Dec 2012 at 05:01AM.  Glucose, Serum  159  BUN  52  Creatinine (comp)  2.06  Sodium, Serum 136  Potassium, Serum 4.8  Chloride, Serum 98  CO2, Serum  35  Calcium (Total), Serum 8.7  Anion Gap  3  Osmolality (calc) 289  eGFR (African American)  25  eGFR (Non-African American)  21 (eGFR values <30m/min/1.73 m2 may be an indication of chronic kidney disease (CKD). Calculated eGFR is useful in patients with stable renal function. The eGFR calculation will not be reliable in acutely ill patients when serum creatinine is changing rapidly. It is not useful in  patients on dialysis. The eGFR calculation may not be applicable to patients at the low and high extremes of body sizes, pregnant women, and vegetarians.)  Cardiac:  08-Jul-14 04:19    Troponin I  0.25 (0.00-0.05 0.05 ng/mL or less: NEGATIVE  Repeat testing in 3-6 hrs  if clinically indicated. >0.05 ng/mL: POTENTIAL  MYOCARDIAL INJURY. Repeat  testing in 3-6 hrs if  clinically indicated. NOTE: An increase or decrease  of 30% or more on serial  testing suggests a  clinically important change)  CK, Total  460  CPK-MB, Serum  7.2 (Result(s) reported on 05 Dec 2012 at 04:59AM.)  Routine Hem:  08-Jul-14 04:19   WBC (CBC) 8.3  RBC (CBC)  2.92  Hemoglobin (CBC)  8.1  Hematocrit (CBC)  25.0  Platelet Count (CBC) 248  MCV 86  MCH 27.9  MCHC 32.6  RDW  15.4  Neutrophil % 92.9  Lymphocyte % 4.1  Monocyte % 2.8  Eosinophil % 0.0  Basophil % 0.2  Neutrophil #  7.7  Lymphocyte #  0.3  Monocyte # 0.2  Eosinophil # 0.0  Basophil # 0.0 (Result(s) reported on 05 Dec 2012 at 04:40AM.)   Radiology Results: [place]    08-Jul-14 17:04, [place]  [place]   REASON FOR EXAM:    acute renal failure  COMMENTS:       PROCEDURE: [place] - [place]  - Dec 05 2012  5:04PM     RESULT: Renal ultrasound demonstrates the right kidney measures 10.32 x   4.50 x 5.09 cm. The left kidney measures 11.02  x 4.34 x 5.04 cm. The   right kidney cortical thickness is 1.26 cm. The left kidney cortical   thickness is not measured. The left kidney is not as well seen as the   right.    IMPRESSION:   1. No urinary obstruction or mass. No stones evident. The urinary bladder   isdecompressed by a catheter. The bladder jets could not be assessed.  Dictation Site: 1        Verified By: Sundra Aland, M.D., MD  Cardiology:    07-Jul-14 15:12, Echo Doppler  Echo Doppler   REASON FOR EXAM:      COMMENTS:       PROCEDURE: [facility] - ECHO DOPPLER COMPLETE(TRANSTHOR)  - Dec 04 2012  3:12PM     RESULT: Echocardiogram Report    Patient Name:   Elizabeth [name] Date of Exam: 12/04/2012  Medical Rec #:  [id number]           Custom1:  Date of Birth:  11-30-1925        Height:        63.0 in  Patient Age:    81 years         Weight:       203.0 lb  Patient Gender: F                BSA:          1.95 m??    Indications: CHF  Sonographer:    Janalee Dane RDCS  Referring Phys: Lavonia Drafts, L    Summary:   1. Left ventricular ejection fraction, by visual estimation, is 60 to   65%.   2. Normal global left ventricular systolic function.   3. Mild left ventricular hypertrophy.   4. Mildly dilated left atrium.   5. Mild to moderate mitral valve regurgitation.   6. Mild aortic regurgitation.   7. Moderate aortic valve stenosis.   8. Moderately elevated pulmonary artery systolic pressure.   9. Mild to moderate tricuspid regurgitation.  2D AND M-MODE MEASUREMENTS (normal ranges within parentheses):  Left Ventricle:          Normal  IVSd (2D):      1.42 cm (0.7-1.1)  LVPWd (2D):     1.01 cm (0.7-1.1) Aorta/LA:                  Normal  LVIDd (2D):     4.43 cm (3.4-5.7) Aortic Root (2D): 3.20 cm (2.4-3.7)  LVIDs (2D):     2.74 cm       Left Atrium (2D): 4.50 cm (1.9-4.0)  LV FS (2D):     38.1 %   (>25%)  LV EF (2D):     68.6 %   (>50%)                                    Right Ventricle:                                    RVd (2D):  LV DIASTOLIC FUNCTION:  MV Peak E: 2.20 m/sDecel Time: 310 msec  MV Peak A: 1.47 m/s  E/A Ratio: 1.50  SPECTRAL DOPPLER ANALYSIS (where applicable):  Mitral Valve:  MV P1/2 Time: 89.90 msec  MV Area, PHT: 2.45 cm??  Aortic Valve: AoV Max Vel: 3.46 m/s AoV  Peak PG: 47.9 mmHg AoV Mean PG:   22.4mHg  LVOT Vmax: 1.57 m/s LVOT VTI: 0.318 m LVOT Diameter: 1.90 cm  AoV Area, Vmax: 1.29 cm?? AoV Area, VTI: 1.32 cm?? AoV Area, Vmn: 1.35 cm??  Tricuspid Valve and PA/RV Systolic Pressure: TR Max Velocity: 3.53 m/s RA   Pressure: 10 mmHg RVSP/PASP: 60.0 mmHg    PHYSICIAN INTERPRETATION:  Left Ventricle: The left ventricular internal cavity size was normal. LV   posterior wall thickness was normal. Mild left ventricular hypertrophy.    Global LV systolic function was normal. Left ventricular ejection  fraction, by visual estimation, is 60 to 65%.  Right Ventricle: The right ventricular size is normal.  Left Atrium: The left atrium is mildly dilated.  Right Atrium: The right atrium is normal in size.  Mitral Valve: Mild to moderate mitral valve regurgitation is seen.  Tricuspid Valve: Mild to moderate tricuspid regurgitation is visualized.   The tricuspid regurgitant velocity is 3.53 m/s, and with an assumed right   atrial pressure of 10 mmHg, the estimated right ventricular systolic   pressure is moderately elevated at 60.0 mmHg.  Aortic Valve: Moderate aortic stenosis is present. Mild aortic valve   regurgitation is seen.  Aorta: The aortic root is normal in size and structure.    1[place]ombMD  Electronically signed by [id number] CowmanMD  Signature Date/Time: 12/04/2012/4:41:01 PM    *** Final ***  IMPRESSION: .        Verified By:Sheppard Coil. PARASCHOS, M.D., MD   Assessment/Plan:  Assessment/Plan:  Assessment 1) copd exacerbation continues with shortness of breath.  2) anemia, heme positive stool   Plan 1) agree with luminal evaluation via egd and colonoscopy however she is high risk from a pulmonary standpoint.  Recommend having pulmonary evaluate patient prior to consideration of further GI evaluation.  Transfuse as needed, daily cbc, recheck hemoccults.  continue ppi.   Electronic Signatures: SLoistine Simas(MD)  (Signed 08-Jul-14 19:05)  Authored: Chief Complaint, VITAL SIGNS/ANCILLARY NOTES, Brief Assessment, Lab Results, Radiology Results, Assessment/Plan   Last Updated: 08-Jul-14 19:05 by SLoistine Simas(MD)

## 2014-09-20 NOTE — Consult Note (Signed)
Reason for Visit: This 81 year old Female patient presents to the clinic for initial evaluation of  breast cancer .   Referred by Dr. Tamala Julian.  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 N0 M0)   Pathology Report pathology report reviewed    Imaging Report mammograms ultrasound reviewed    Referral Report clinical notes reviewed    Planned Treatment Regimen adjuvant breast radiation and aromatase inhibitor    HPI   patient is an 81 year old female with COPD oxygen dependent who presented on a recent mammogram with a new mass in the 1:00 position corresponding to the superior aspect of the right breast. Underwent needle localization positive for invasive mammary carcinoma. Tumor was 0.9 cm deep margin initially positive although on reexcision was negative. Tumor was ER positive PR borderline at 1-5%. Tumor was grade 1 with mucinous features. There was also papillary lesion with sclerosis noted in another portion of the breast which is also excised. Patient has done well although has some thickening in the scar site. She is now referred to both medical oncology and radiation oncology for consideration of treatment. She is having no significant breast tenderness at this time. She is wheelchair dependent.  Past Hx:    Cancer Right Breast:    HTN:    Diabetes Mellitus, Type II (NIDD):    copd:    hysterectomy:   Past, Family and Social History:   Past Medical History positive    Cardiovascular hyperlipidemia; hypertension; angina pectoris    Respiratory asthma; COPD; sleep apnea    Gastrointestinal GERD    Genitourinary kidney stones    Endocrine diabetes mellitus; hypothyroidism    Neurological/Psychiatric depression; macular degeneration, diabetic retinopathy, hard of hearing    Past Surgical History D&C,  tonsillectomy, thyroid surgery, cataract surgery, tear duct surgery, hysterectomy    Past Medical History Comments pernicious anemia,vitamin D deficiency    Family History noncontributory    Social History noncontributory    Additional Past Medical and Surgical History accompanied by multiple family members today   Allergies:   Augmentin: N/V  Doxycycline: N/V  Seldane: GI Distress  Erythromycin: GI Distress  Metformin: GI Distress  Welchol: GI Distress  Glucophage: GI Distress  Zithromax: SOB  Cipro: Cough  Januvia: GI Distress  Home Meds:  Home Medications: Medication Instructions Status  lorazepam 0.5 mg oral tablet 1 tab(s) orally every 8 hours as needed  Active  Effexor tablet 75 mg 1 tab(s) orally once a day x 30 days  Active  Actos tablet 45 mg 1 tab(s) orally once a day  Active  Advair Diskus 250 mcg-50 mcg inhalation powder 1 puff(s) inhaled 2 times a day Active  amlodipine 2.5 mg oral tablet 1 tab(s) orally once a day (at bedtime) Active  Flonase 50 mcg/inh nasal spray 2 spray(s) nasal once a day (at bedtime) Active  Spiriva 18 mcg inhalation capsule 1 each inhaled once a day at lunch time Active  Combivent 18 mcg-103 mcg-/inh inhalation aerosol 2 puff(s) inhaled every 12 hours, As Needed- for Shortness of Breath  Active  doxepin 25 mg oral capsule 1 cap(s) orally once a day (at bedtime) Active  lisinopril 40 mg oral tablet 1 tab(s) orally once a day Active  Wellbutrin 100 mg oral tablet 1 tab(s) orally once a day (at bedtime) Active  NovoLog FlexPen 1 dose(s) subcutaneous 5  units at breakfast, 6 units at lunch and 8 units at supper. Active  Calcium 600+D 1  orally 2 times a day Active  Synthroid 25 mcg (0.025 mg) oral tablet 1 tab(s) orally once a day Active  fenofibrate 160 mg oral tablet 1 tab(s) orally once a day (at bedtime) Active  Equate Arthritis 2 tab(s) orally 2 times a day, As Needed- for Pain  Active  PreserVision oral tablet 1 tab(s) orally 2 times a  day Active  Mucinex 600 mg oral tablet, extended release 1 tab(s) orally every 12 hours, As Needed Active  pantoprazole 40 mg oral delayed release tablet 1 tab(s) orally once a day Active  pantoprazole 40 mg oral delayed release tablet 1 tab(s) orally once a day Active   Review of Systems:   General negative    Performance Status (ECOG) 1    Skin negative    Breast see HPI    Ophthalmologic see HPI    ENMT negative    Respiratory and Thorax see HPI    Cardiovascular see HPI    Gastrointestinal see HPI    Genitourinary negative    Musculoskeletal negative    Neurological negative    Psychiatric see HPI    Hematology/Lymphatics see HPI    Endocrine see HPI    Allergic/Immunologic negative    Review of Systems   according to nurse's notesPatient denies any weight loss, fatigue, weakness, fever, chills or night sweats. Patient denies any loss of vision, blurred vision. Patient denies any ringing  of the ears or hearing loss. No irregular heartbeat. Patient denies heart murmur or history of fainting. Patient denies any chest pain or pain radiating to her upper extremities. Patient denies any shortness of breath, difficulty breathing at night, cough or hemoptysis. Patient denies any swelling in the lower legs. Patient denies any nausea vomiting, vomiting of blood, or coffee ground material in the vomitus. Patient denies any stomach pain. Patient states has had normal bowel movements no significant constipation or diarrhea. Patient denies any dysuria, hematuria or significant nocturia. Patient denies any problems walking, swelling in the joints or loss of balance. Patient denies any skin changes, loss of hair or loss of weight. Patient denies any excessive worrying or anxiety or significant depression. Patient denies any problems with insomnia. Patient denies excessive thirst, polyuria, polydipsia. Patient denies any swollen glands, patient denies easy bruising or easy bleeding.  Patient denies any recent infections, allergies or URI. Patient "s visual fields have not changed significantly in recent time.  Nursing Notes:  Nursing Vital Signs and Chemo Nursing Nursing Notes: *CC Vital Signs Flowsheet:   09-Jan-14 14:26   Temp Temperature 97.4   Pulse Pulse 91   Respirations Respirations 20   SBP SBP 194   DBP DBP 157   Pain Scale (0-10)  0   Current Weight (kg) (kg) 94.2   Height (cm) centimeters 161.7   BSA (m2) 1.9   Physical Exam:  General/Skin/HEENT:   General normal    Skin normal    Eyes normal    ENMT normal    Head and Neck normal    Additional PE well-developed wheelchair-bound female in NAD on nasal oxygen. She status post a wide local excision of the right breast which is healing well. There some thickening underneath the scar may be some calcified blood. Otherwise no other dominant mass or nodularity is noted in either breast into position examined. Lungs are clear to A&P cardiac examination shows regular rate and rhythm.no axillary  or supraclavicular adenopathy is appreciated.   Breasts/Resp/CV/GI/GU:   Respiratory and Thorax normal    Cardiovascular normal    Gastrointestinal normal    Genitourinary normal   MS/Neuro/Psych/Lymph:   Musculoskeletal normal    Neurological normal    Lymphatics normal   Assessment and Plan:  Impression:   stage I ER/PR positive invasive mammary carcinoma mucinous type status post wide local excision in 81 year old female  Plan:   I have discussed the case personally with Dr. Grayland Ormond. He will be offering her aromatase inhibitor after completion of radiation. I like to do a rapid course of radiation therapy over 4 weeks to her whole breast and boost or scar another 1600 cGy in 5 fractions using electron beam. Risks and benefits of whole breast radiation were explained to the patient and her family. I have set her up for CT simulation at the end of next week. We'll we will allow little more healing  of the breast at this time. Patient seems to comprehend our treatment plan well. Patient will be set up for aromatase inhibitor after completion of radiation.  I would like to take this opportunity to thank you for allowing me to continue to participate in this patient's care.  Electronic Signatures: Armstead Peaks (MD)  (Signed 09-Jan-14 15:35)  Authored: HPI, Diagnosis, Past Hx, PFSH, Allergies, Home Meds, ROS, Nursing Notes, Physical Exam, Encounter Assessment and Plan   Last Updated: 09-Jan-14 15:35 by Armstead Peaks (MD)

## 2014-09-20 NOTE — Consult Note (Signed)
PATIENT NAME:  Elizabeth [name], Elizabeth [name] MR#:  [id number] DATE OF BIRTH:  [birth year]  DATE OF CONSULTATION:  12/05/2012  REFERRING PHYSICIAN:  Rolly Pancake. Cherlynn Kaiser, MD CONSULTING PHYSICIAN:  Marcina Millard, MD  PRIMARY CARE PHYSICIAN: [place], MD  CHIEF COMPLAINT: Shortness of breath.   REASON FOR CONSULTATION: Consultation requested for evaluation of elevated troponin.   HISTORY OF PRESENT ILLNESS: The patient is an 81 year old female who presented to urgent care on 12/01/2012, diagnosed with urinary tract infection, treated with oral Septra. The patient continued to feel ill, with worsening shortness of breath and presented to [facility] Emergency Room. Chest x-ray was suggestive of pulmonary edema, and the patient was admitted for further evaluation. Admission labs were notable for borderline elevated troponin of 0.21 in the absence of chest pain or ECG changes. BUN and creatinine were mildly elevated at 38 and 1.72, respectively. The patient was found to be anemic with a hemoglobin and hematocrit of 7.7 and 23.9, respectively. The patient reports that she has noted blood on the toilet paper over the last week. She denies any bloody or melenic stools.   PAST MEDICAL HISTORY:  1. COPD. 2. Hypertension.  3. Diabetes.  4. Depression.  5. History of breast cancer.   MEDICATIONS ON ADMISSION:  1. Amlodipine 2.5 mg q.h.s.   2. Fenofibrate 160 mg q.h.s.  3. Tylenol 650 mg 2 tablets q.8. 4. Actos 45 mg daily.  5. Advair 250/50 one puff b.i.d. 6. Fosamax 70 mg weekly.  7. Bupropion 100 mg daily.  8. Calcium/vitamin D 1 tablet b.i.d. 9. Doxepin 25 mg at q.h.s.  10. Effexor 75 mg daily.  11. Flonase nasal spray 2 sprays to each nostril daily.  12. Letrozole 2.5 mg daily.  13. Lisinopril 40 mg daily.  14. Mucinex 600 mg b.i.d.  15. NovoLog FlexPen 5 units at breakfast, 6 units at lunch, 8 units at supper. 16. Protonix 40 mg daily.  17. PreserVision 1 tablet b.i.d. 18. Spiriva 1 puff daily.  19.  Bactrim double strength 1 tablet b.i.d.   SOCIAL HISTORY: The patient currently lives by herself. She has 20-pack-year tobacco abuse history, quit 20 years ago.   FAMILY HISTORY: No immediate family history for coronary artery disease or myocardial infarction.   REVIEW OF SYSTEMS:  CONSTITUTIONAL: No fever or chills.  EYES: No blurry vision.  EARS: No hearing loss.  RESPIRATORY: The patient has had a 2-week history of shortness of breath and nonproductive cough.  CARDIOVASCULAR: No chest pain.  GASTROINTESTINAL: No nausea, vomiting or diarrhea.  GENITOURINARY: No dysuria or hematuria.  ENDOCRINE: No polyuria or polydipsia.  MUSCULOSKELETAL: No arthralgias or myalgias.  NEUROLOGICAL: No focal muscle weakness or numbness.  PSYCHOLOGICAL: The patient does have history of depression.   PHYSICAL EXAMINATION:  VITAL SIGNS: Blood pressure 95/57, pulse 78, respirations 22, temperature 98.2, pulse oximetry 94%.  HEENT: Pupils equally reactive to light and accommodation.  NECK: Supple without thyromegaly.  LUNGS: Clear.  CARDIOVASCULAR: Normal JVP. Normal PMI. Regular rate and rhythm. Normal S1, S2. No appreciable gallop, murmur or rub.  ABDOMEN: Soft and nontender. Pulses were intact bilaterally.  MUSCULOSKELETAL: Normal muscle tone.  NEUROLOGICAL: The patient is alert and oriented x3. Motor and sensory both grossly intact.   ACCESSORY DATA: Echocardiogram performed 12/04/2012 revealed normal left ventricular function with moderate aortic stenosis.   IMPRESSION: An 81 year old female who presents with 2-week history of shortness of breath, most likely due to exacerbation of chronic obstructive pulmonary disease with an element of congestive heart failure,  though the patient does not appear to be overtly fluid overloaded. Echocardiogram reveals normal left ventricular function. Troponin is borderline elevated in the absence of chest pain. The patient does present with significant anemia.  Elevated troponin likely due to demand-supply ischemia, not due to acute myocardial infarction.   RECOMMENDATIONS:  1. Agree with overall current therapy.  2. Would defer full-dose anticoagulation, especially in light of significant anemia.  3. The patient currently appears to be euvolemic. Would defer Lasix therapy at this time.  4. Would defer further cardiac noninvasive or invasive evaluation at this time.  5. Would proceed with GI workup as deemed necessary.  ____________________________ Marcina MillardAlexander Kalum Minner, MD ap:OSi D: 12/05/2012 07:37:58 ET T: 12/05/2012 08:07:48 ET JOB#: [id number]  cc: Marcina MillardAlexander Edel Rivero, MD, <Dictator> Marcina MillardALEXANDER Tel Hevia MD ELECTRONICALLY SIGNED 12/19/2012 12:29

## 2014-09-20 NOTE — Consult Note (Signed)
PATIENT NAME:  Elizabeth [name], Elizabeth [name] MR#:  [id number] DATE OF BIRTH:  May 03, 1926  DATE OF CONSULTATION:  12/04/2012  REFERRING PHYSICIAN:  Dr. Cherlynn Kaiser. CONSULTING PHYSICIAN:  Hardie Shackleton. Colin Benton, PA-C  Dictating for Dr. Barnetta Chapel.  REASON FOR CONSULTATION:  Anemia.   HISTORY OF PRESENT ILLNESS:  This is a pleasant 81 year old female whom initially presented to the Emergency Department with concerns of increasing shortness of breath.  Upon further work-up, she did have a markedly elevated BNP, a mildly elevated troponin and on chest x-ray it was suggestive of signs of congestive heart failure as there was some cardiomegaly and pulmonary edema.  Of note, she also has COPD for which she is on 3 liters of oxygen at home.  We were consulted on for anemia as her hemoglobin in the ER was found to be 7.7.  This was normocytic.  Her baseline however has tended to be greater than 11 and therefore this decline was of some concern.  Iron studies were obtained with a low serum iron level of 29, but otherwise TIBC, iron sat and ferritin were within normal limits.  MCV was 87.  Last colonoscopy was February of 2012 by Dr. Barnetta Chapel, notable for diverticulosis and a 3 mm tubular adenoma.  The patient denies noticing any gross bright red blood per rectum or melena.  No abdominal pain.  No heartburn, dyspepsia or dysphagia.  No digestive symptomatic concerns or complaints.  She does state that she is chronically constipated secondary to her medications.   ALLERGIES:  AUGMENTIN, DOXYCYCLINE, GLUCOPHAGE, JANUVIA, ZITHROMAX, WELCHOL, ERYTHROMYCIN AND FLUOROQUINOLONES.   PAST MEDICAL HISTORY:  History of breast cancer, COPD, congestive heart failure, hypertension, diabetes mellitus, osteoporosis and depression.  Also a history of diverticulosis and colon polyps.   PAST SURGICAL HISTORY:  Left cataract surgery, tear duct surgery, right partial mastectomy, hysterectomy, D and C and tonsillectomy.  Also thyroid surgery.    SOCIAL HISTORY:  The patient is a former tobacco user, quit approximately 20 years ago.  No current alcohol, tobacco or illicit drug use.   FAMILY HISTORY:  There is no known family history of GI malignancy, colon polyps or IBD.   HOME MEDICATIONS:  Tylenol, Actos, Advair, Fosamax, amlodipine, bupropion, calcium and vitamin D, doxepin, Effexor, fenofibrate, Flonase, letrozole, lisinopril, Mucinex, NovoLog Flex pen, Protonix, PreserVision, Spiriva and Bactrim.   REVIEW OF SYSTEMS:  A 10 system review of systems was obtained on the patient.  The patient complains of shortness of breath and chest discomfort.  All other pertinent positives are mentioned above and are otherwise negative.   PHYSICAL EXAMINATION: VITAL SIGNS:  Heart rate 94, blood pressure is stable.  GENERAL:  This is a pleasant 81 year old female seen in the Emergency Room in no acute distress.  Alert and oriented x 3.  HEAD:  Atraumatic, normocephalic.  NECK:  Supple.  No lymphadenopathy noted.  HEENT:  Sclerae anicteric.  Mucous membranes moist.  Currently has a nasal cannula in place.  LUNGS:  Respirations are much improved since initial presentation, but is still somewhat labored.  No current accessory muscle use.  ABDOMEN:  Soft, nontender, nondistended.  Normoactive bowel sounds in all 4 quadrants.  CARDIAC:  Regular rate and rhythm.  EXTREMITIES:  Positive for lower extremity edema, mild.  Bilateral 2+ pulses noted bilateral upper extremities.  PSYCHIATRIC:  Appropriate mood and affect.   LABORATORY DATA:  White blood cells 11.2, hemoglobin 7.7, hematocrit 23.9, platelets 222.  Sodium 137, potassium 4.7, BUN 38, creatinine 1.72, glucose  170.  BNP 2927, troponin 0.21, MCV is 87, TIBC 438, serum iron 29, iron sat 7, ferritin 83, retic count is 1.49.   IMAGING STUDIES:  A chest x-ray was obtained on the patient, notable for cardiomegaly suggestive of congestive heart failure.  Also signs of pulmonary edema.    Echocardiogram is pending.   ASSESSMENT: 1.  Anemia of unclear etiology.  2.  Abnormal chest x-ray suggesting a congestive heart failure exacerbation with cardiomegaly and pulmonary edema.  3.  Shortness of breath likely secondary to above.  4.  History of chronic obstructive pulmonary disease on 3 liters of oxygen at home.   PLAN:  I have discussed this patient's case in detail with Dr. Barnetta ChapelMartin Skulskie who is involved in the development of the patient's plan of care.  At this time, we do recommend checking serial hemoglobins and being prepared to transfuse as necessary.  The etiology of her anemia is unclear at this standpoint as it is normocytic and she does have a normal TIBC and ferritin level at this current moment.  She was found to be heme-positive as well.  Therefore, we do recommend stabilizing her cardiopulmonary status first and we will reassess the patient for possible luminal evaluation at that time if clinically warranted.  We will continue to monitor this patient throughout hospitalization and make further recommendations pending above and per clinical course.   Thank you so much for this consultation and for allowing us to participate in the patient's plan of care.    ____________________________ Hardie ShackletonKaryn M. Colin BentonEarle, PA-C kme:ea D: 12/04/2012 16:55:09 ET T: 12/04/2012 18:51:58 ET JOB#: [id number]  cc: Hardie ShackletonKaryn M. Anterrio Mccleery, PA-C, <Dictator> Hardie ShackletonKARYN M Eulalie Speights PA ELECTRONICALLY SIGNED 12/05/2012 13:31

## 2014-09-20 NOTE — Consult Note (Signed)
Chief Complaint:  Subjective/Chief Complaint seen for amenia and heme positive stool.   admitted with copd exacerbation.  breathing some better, but still short of breath at times.  denies n/v or abdominal pain.   VITAL SIGNS/ANCILLARY NOTES: **Vital Signs.:   09-Jul-14 08:35  Vital Signs Type Routine  Temperature Temperature (F) 98.8  Celsius 37.1  Pulse Pulse 84  Respirations Respirations 20  Systolic BP Systolic BP 131  Diastolic BP (mmHg) Diastolic BP (mmHg) 53  Mean BP 79  Pulse Ox % Pulse Ox % 99  Pulse Ox Activity Level  At rest  Oxygen Delivery 4L  *Intake and Output.:   09-Jul-14 09:16  Stool  lg. soft   Brief Assessment:  Cardiac Regular   Respiratory clear BS   Gastrointestinal details normal Soft  Nontender  Nondistended  Bowel sounds normal   Lab Results:  Routine Chem:  07-Jul-14 11:49   BUN  38  Creatinine (comp)  1.72  08-Jul-14 04:19   BUN  52  Creatinine (comp)  2.06  09-Jul-14 04:02   BUN  72  Creatinine (comp)  1.99  Routine Sero:  09-Jul-14 12:16   Occult Blood, Feces POSITIVE (Result(s) reported on 06 Dec 2012 at 01:32PM.)  Routine Hem:  07-Jul-14 11:49   WBC (CBC)  11.2  RBC (CBC)  2.73  Hemoglobin (CBC)  7.7  Hematocrit (CBC)  23.9  Platelet Count (CBC) 222  MCV 87  MCH 28.1  MCHC 32.1  RDW  14.7  Retic Count 1.49 (0.4-3.1 NOTE: New reference range. 07/22/12)  Absolute Retic Count 0.0414 (0.019-0.186 NOTE: New Reference Range 07/22/12)  08-Jul-14 04:19   WBC (CBC) 8.3  RBC (CBC)  2.92  Hemoglobin (CBC)  8.1  Hematocrit (CBC)  25.0  Platelet Count (CBC) 248  MCV 86  MCH 27.9  MCHC 32.6  RDW  15.4  Neutrophil % 92.9  Lymphocyte % 4.1  Monocyte % 2.8  Eosinophil % 0.0  Basophil % 0.2  Neutrophil #  7.7  Lymphocyte #  0.3  Monocyte # 0.2  Eosinophil # 0.0  Basophil # 0.0 (Result(s) reported on 05 Dec 2012 at 04:40AM.)  09-Jul-14 04:02   WBC (CBC) 9.9  RBC (CBC)  2.92  Hemoglobin (CBC)  8.4  Hematocrit (CBC)  25.0   Platelet Count (CBC) 268  MCV 86  MCH 28.8  MCHC 33.7  RDW  15.1  Neutrophil % 93.4  Lymphocyte % 3.3  Monocyte % 3.2  Eosinophil % 0.0  Basophil % 0.1  Neutrophil #  9.3  Lymphocyte #  0.3  Monocyte # 0.3  Eosinophil # 0.0  Basophil # 0.0 (Result(s) reported on 06 Dec 2012 at 05:19AM.)   Radiology Results: XRay:    07-Jul-14 11:34, Chest PA and Lateral  Chest PA and Lateral   REASON FOR EXAM:    SOB  COMMENTS:       PROCEDURE: DXR - DXR CHEST PA (OR AP) AND LATERAL  - Dec 04 2012 11:34AM     RESULT: Comparison is made to the study of 04/06/2011. The cardiac   silhouette is enlarged. There is diffuse interstitial edema with  pulmonary vascular congestion. There is no effusion or pneumothorax. The   bones are osteopenic.    IMPRESSION:  Continued cardiomegaly with changes of pulmonary edema   likely secondary to congestive heart failure.    Dictation Site: 2    VerifiedBy: Elveria Royals, M.D., MD  [place]:    08-Jul-14 17:04, US Kidney Bilateral  US Kidney Bilateral   REASON FOR EXAM:    acute renal failure  COMMENTS:       PROCEDURE: [place]  - US KIDNEY  - Dec 05 2012  5:04PM     RESULT: Renal ultrasound demonstrates the right kidney measures 10.32 x   4.50 x 5.09 cm. The left kidney measures 11.02 x 4.34 x 5.04 cm. The   right kidney cortical thickness is 1.26 cm. The left kidney cortical   thickness is not measured. The left kidney is not as well seen as the   right.    IMPRESSION:   1. No urinary obstruction or mass. No stones evident. The urinary bladder   isdecompressed by a catheter. The bladder jets could not be assessed.  Dictation Site: 1        Verified By: Elveria Royals, M.D., MD  Cardiology:    07-Jul-14 15:12, Echo Doppler  Echo Doppler   REASON FOR EXAM:      COMMENTS:       PROCEDURE: [facility] - ECHO DOPPLER COMPLETE(TRANSTHOR)  - Dec 04 2012  3:12PM     RESULT: Echocardiogram Report    Patient Name:   Elizabeth [name] Date of Exam:  12/04/2012  Medical Rec #:  [id number]           Custom1:  Date of Birth:  [birth year]        Height:       63.0 in  Patient Age:    81 years         Weight:       203.0 lb  Patient Gender: F                BSA:          1.95 m??    Indications: CHF  Sonographer:    Nestor Ramp RDCS  Referring Phys: Jene Every, L    Summary:   1. Left ventricular ejection fraction, by visual estimation, is 60 to   65%.   2. Normal global left ventricular systolic function.   3. Mild left ventricular hypertrophy.   4. Mildly dilated left atrium.   5. Mild to moderate mitral valve regurgitation.   6. Mild aortic regurgitation.   7. Moderate aortic valve stenosis.   8. Moderately elevated pulmonary artery systolic pressure.   9. Mild to moderate tricuspid regurgitation.  2D AND M-MODE MEASUREMENTS (normal ranges within parentheses):  Left Ventricle:          Normal  IVSd (2D):      1.42 cm (0.7-1.1)  LVPWd (2D):     1.01 cm (0.7-1.1) Aorta/LA:                  Normal  LVIDd (2D):     4.43 cm (3.4-5.7) Aortic Root (2D): 3.20 cm (2.4-3.7)  LVIDs (2D):     2.74 cm       Left Atrium (2D): 4.50 cm (1.9-4.0)  LV FS (2D):     38.1 %   (>25%)  LV EF (2D):     68.6 %   (>50%)                                    Right Ventricle:  RVd (2D):  LV DIASTOLIC FUNCTION:  MV Peak E: 2.20 m/sDecel Time: 310 msec  MV Peak A: 1.47 m/s  E/A Ratio: 1.50  SPECTRAL DOPPLER ANALYSIS (where applicable):  Mitral Valve:  MV P1/2 Time: 89.90 msec  MV Area, PHT: 2.45 cm??  Aortic Valve: AoV Max Vel: 3.46 m/s AoV Peak PG: 47.9 mmHg AoV Mean PG:   22.760mmHg  LVOT Vmax: 1.57 m/s LVOT VTI: 0.318 m LVOT Diameter: 1.90 cm  AoV Area, Vmax: 1.29 cm?? AoV Area, VTI: 1.32 cm?? AoV Area, Vmn: 1.35 cm??  Tricuspid Valve and PA/RV Systolic Pressure: TR Max Velocity: 3.53 m/s RA   Pressure: 10 mmHg RVSP/PASP: 60.0 mmHg    PHYSICIAN INTERPRETATION:  Left Ventricle: The left ventricular internal cavity  size was normal. LV   posterior wall thickness was normal. Mild left ventricular hypertrophy.   Global LV systolic function was normal. Left ventricular ejection  fraction, by visual estimation, is 60 to 65%.  Right Ventricle: The right ventricular size is normal.  Left Atrium: The left atrium is mildly dilated.  Right Atrium: The right atrium is normal in size.  Mitral Valve: Mild to moderate mitral valve regurgitation is seen.  Tricuspid Valve: Mild to moderate tricuspid regurgitation is visualized.   The tricuspid regurgitant velocity is 3.53 m/s, and with an assumed right   atrial pressure of 10 mmHg, the estimated right ventricular systolic   pressure is moderately elevated at 60.0 mmHg.  Aortic Valve: Moderate aortic stenosis is present. Mild aortic valve   regurgitation is seen.  Aorta: The aortic root is normal in size and structure.    [id number] Marcina MillardAlexander Paraschos MD  Electronically signed by [id number] Marcina MillardAlexander Paraschos MD  Signature Date/Time: 12/04/2012/4:41:01 PM    *** Final ***  IMPRESSION: .        Verified ByLyn Hollingshead: ALEXANDER . PARASCHOS, M.D., MD   Assessment/Plan:  Assessment/Plan:  Assessment 1) anemia, heme positive stool, iron deficiency 2) copd exacerbation, htn, breast Ca, DM2, depression   Plan 1) egd and colonoscopy when clinically feasible with anesthesia assistance.  Will arrange for friday, to allow another day of improvement or respiratory status.   Electronic Signatures for Addendum Section:  Barnetta ChapelSkulskie, Martin (MD) (Signed Addendum 09-Jul-14 14:31)  I have discussed the risks benefits and complicatiosn of egd to include not limited to bleeding infection perofration and sedation and she wishes to proceed.   Electronic Signatures: Barnetta ChapelSkulskie, Martin (MD)  (Signed 09-Jul-14 14:30)  Authored: Chief Complaint, VITAL SIGNS/ANCILLARY NOTES, Brief Assessment, Lab Results, Radiology Results, Assessment/Plan   Last Updated: 09-Jul-14 14:31 by Barnetta ChapelSkulskie, Martin (MD)

## 2014-09-20 NOTE — Discharge Summary (Signed)
PATIENT NAME:  Elizabeth [name], Elizabeth [name] MR#:  [id number] DATE OF BIRTH:  [birth year]  DATE OF ADMISSION:  12/04/2012 DATE OF DISCHARGE:  12/08/2012  ADMITTING DIAGNOSIS: Shortness of breath.   DISCHARGE DIAGNOSES: 1. Shortness of breath felt to be multifactorial including acute on chronic obstructive pulmonary disease exacerbation as well as possible diastolic congestive heart failure as well as multivalvular heart disease as well as anemia.  2. Acute on chronic obstructive pulmonary disease exacerbation. The patient treated with nebulizers, steroids and inhalers seen by her primary pulmonologist.  3. Possible acute diastolic congestive heart failure treated with IV Lasix but then her creatinine increased, so Lasix was stopped.  4. Acute renal failure, resolved with discontinuation of Lasix.  5. Hypertension.  6. Diabetes.  7. Anemia with low iron ferritin. The patient to have outpatient colonoscopy, started on iron supplements status post transfusion.  8. Depression.  9. Gastroesophageal reflux disease.  10. 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  11. Previous history of breast cancer.  12. Osteoporosis.   PERTINENT LABS AND EVALUATIONS: Admitting BMP glucose was 160. BNP was 2927. Iron level was 29, BUN 38, creatinine 1.72, sodium 137. Her folic acid was 14.8, potassium 4.7, chloride 101, CO2 was 30. Iron saturations were 7, ferritin levels were 83. CPK was 442. Troponin 0.21. WBC 11.2, hemoglobin 7.7, platelet count was 222. Blood cultures x 2 no growth.  Vitamin B12 level was 242. Echocardiogram of the heart showed ejection fraction 60% to 65%, mild left ventricular hypertrophy, mildly dilated left atrium mild-to-moderate mitral valve regurg, mild aortic regurg, moderate aortic valve stenosis, moderately elevated pulmonary artery pressures, mild to moderate tricuspid regurgitation. Ultrasound of the kidneys showed no obstruction. Most recent CBC was 9.1 on 07/10. Creatinine is  1.28 today. Chest x-ray, on admission, showed cardiomegaly with changes of possible pulmonary edema.   HOSPITAL COURSE: Please refer to H and P done by the admitting physician. The patient is an 81 year old white female who presented to the hospital with shortness of breath getting worst over the past few days. The patient is chronically on oxygen at 3 liters and was still desatting. The patient was evaluated in the ED and was noted to be anemic. Also thought to have possible congestive heart failure. She was also thought to have a chronic obstructive pulmonary disease flare. The patient was given a transfusion for her anemia. She was started on Lasix for possible congestive heart failure. She had an echocardiogram done which showed multi-valvular disease. She was also seen by cardiology. She did have some troponins that were elevated, felt to be demand ischemia. The patient also was treated for chronic obstructive pulmonary disease exacerbation and was seen by a pulmonologist. She had multiple reasons for having shortness of breath. She received transfusion for anemia with these treatments, her shortness of breath is currently back to baseline. The patient was also seen by GI for iron deficiency anemia. They were planning to do a colonoscopy, but due to her fragile breathing state, they decided not to do the colonoscopy. She will follow up as an outpatient for further evaluation regarding this. The patient is currently stable and will need outpatient follow up. Also the patient developed acute renal failure during hospitalization was seen by nephrology. Her Lasix was held. Currently, her renal function has improved. At this time, discharge instructions for heart failure are given.   DISCHARGE MEDICATIONS:  1. Actos 45 p.o. daily. 2. Effexor-XR 75 one tab p.o. daily.  3. Advair 250/50 one  puff b.i.d.  4. Amlodipine 2.5 p.o. daily.  5. Spiriva 1 inhalation daily.  6. Doxepin 25 mg at bedtime.   7. Lisinopril 40 daily.  8. NovoLog FlexPen 5 units at breakfast, 6 units at lunch, 8 units at supper.  9. Calcium plus vitamin D 1 tab p.o. b.i.d.  10. Fenofibrate 160 at bedtime.  11. (Dictation Anomaly)  1 tab p.o. b.i.d.  12. Mucinex 600 mg 1 tab p.o. q. 12.  13. Protonix 40 daily.  14. Letrozole 2.5 mg 1 tab p.o. daily. 15. Alendronate 70 mg weekly.  16. Bupropion 100 daily. 17. Flonase 2 sprays once a day to each nostril.  18. Acetaminophen 650 q. 8 p.r.n.  19. Prednisone taper 60 mg taper x 10 until complete.  20. Iron polysaccharide 150 one tab p.o. b.i.d.  21. Lasix 20 daily as needed for swelling. 22. O2 3 liters.   DIET: Low sodium, low fat, low cholesterol, regular consistency.   ACTIVITY: As tolerated with a rolling walker.  FOLLOWUP: With primary M.D. in 1 to 2 weeks. Follow up with Dr. Meredeth IdeFleming in 4 to 6 weeks. Follow up with Dr. Marva PandaSkulskie in 1 to 2 weeks for possible esophagogastroduodenoscopy and a colonoscopy.   TIME SPENT: 35 minutes spent on the discharge.   ____________________________ Lacie ScottsShreyang H. Allena KatzPatel, MD shp:rw D: 12/08/2012 14:14:46 ET T: 12/08/2012 15:42:15 ET JOB#: [id number]  cc: Katelyn Broadnax H. Allena KatzPatel, MD, <Dictator> Charise CarwinSHREYANG H Janeen Watson MD ELECTRONICALLY SIGNED 12/12/2012 11:03

## 2014-09-20 NOTE — Consult Note (Signed)
PATIENT NAME:  Elizabeth [name], Elizabeth [name] MR#:  [id number] DATE OF BIRTH:  05-Oct-1925  DATE OF CONSULTATION:  12/05/2012  REFERRING PHYSICIAN:  Dustin Flock, MD CONSULTING PHYSICIAN:  [place] Dani Lilian Kapur, MD  REASON FOR CONSULTATION: Acute renal failure in the setting of chronic kidney disease stage III with baseline eGFR of 51.   HISTORY OF PRESENT ILLNESS: The patient is a very pleasant 81 year old Caucasian female with past medical history of right breast cancer, COPD, hypertension, diabetes mellitus, depression, osteoporosis, history of hysterectomy, who presented to [facility] with progressive shortness of breath. The patient relates to me that she has had increasing shortness of breath over the past 2 weeks. She has quite significant dyspnea with exertion. She has known underlying COPD.  She denies any recent sick contacts. She was also recently diagnosed with a urinary tract infection and was started on Bactrim. In the Emergency Department, she was potentially thought to have congestive heart failure, however, ejection fraction was found to be 60% to 65% on 2-D echocardiogram. The patient was administered Lasix. The patient's creatinine upon presentation was 1.72 and has now risen to 2.06. The patient's baseline creatinine appears to be 1.09 with an eGFR of 51. She had a urinalysis which indicated ongoing hematuria and pyuria. Trace proteinuria was noted.   PAST MEDICAL HISTORY 1.  Right breast cancer, followed by Dr. Grayland Ormond.  2.  COPD.   3.  Hypertension.  4.  Diabetes mellitus.  5.  Depression.  6.  Osteoporosis.  7.  Hysterectomy.  8.  Chronic kidney disease stage 3, baseline creatinine 1.09 with eGFR of 51.   ALLERGIES: AUGMENTIN, CIPRO, DOXYCYCLINE, ERYTHROMYCIN, GLUCOPHAGE, JANUVIA, SELDANE, WELCHOL AND ZITHROMAX.   CURRENT INPATIENT MEDICATIONS:  Include: 1.  Tylenol 650 mg p.o. every 4 hours p.r.n. pain.  2.  Fosamax 70 mg p.o. weekly.  3.  Amlodipine 2.5 mg p.o. at  bedtime.  4.  Wellbutrin 100 mg p.o. daily.  5.  Doxepin 25 mg p.o. at bedtime.  6.  Fenofibrate 160 mg p.o. daily.  7.  Fluticasone 2 sprays both nostrils daily.  8.  Advair Diskus 250/50, 1 puff inhaled b.i.d.  9.  Mucinex 600 mg p.o. q.12 hours.  10.  NovoLog insulin 5 units subcutaneous with breakfast and 6 units with lunch and 8 units with supper.  11.  Sliding scale insulin.  12.  Letrozole 2.5 mg p.o. daily.  13.  Ativan 0.5 mg p.o. at bedtime.  14.  Solu-Medrol 40 mg IV every 8 hours.  15.  Zofran 4 mg IV every 4 hours p.r.n.  16.  Protonix 40 mg p.o. every 6:00 a.m.  17.  Pioglitazone 45 mg p.o. daily.  18.  Tiotropium 1 capsule inhaled daily.  19.  Venlafaxine 75 mg p.o. daily.  20.  Furosemide 20 mg p.o. b.i.d.  21.  Magic mouthwash 10 mL p.o. t.i.d.  22.  Ferrex 150 mg p.o. b.i.d.  23.  Levofloxacin 250 mg p.o. every 48 hours.   SOCIAL HISTORY: The patient lives in [place]. She is widowed. She lives alone. She quit smoking tobacco approximately 20 years ago. She denies alcohol or illicit drug use.   FAMILY HISTORY: Mother died secondary to complications from bradycardia and was unable to receive a pacemaker prior to passing. The patient's father died secondary to some type of ENT cancer.   REVIEW OF SYSTEMS CONSTITUTIONAL: Denies fevers, chills or weight loss.  EYES: Reports history of diabetic retinopathy.  HEENT: Denies headaches or hearing loss.  Denies epistaxis.  CARDIOVASCULAR: Denies chest pains or palpitations but reports dyspnea with exertion.  RESPIRATORY: Endorses cough, shortness of breath that has been progressive. Denies hemoptysis.  GASTROINTESTINAL: Denies nausea, vomiting, diarrhea.  GENITOURINARY: Denies frequency, urgency or dysuria.  MUSCULOSKELETAL: Denies joint pain, swelling or redness.  INTEGUMENTARY: Denies skin rashes or lesions.  NEUROLOGIC: Denies focal extremity weakness or seizure disorder.  PSYCHIATRIC: Has known history of depression.   ENDOCRINE: Denies polyuria, polydipsia. Does have history of diabetes mellitus. HEMATOLOGIC AND LYMPHATIC: Denies easy bruisability, bleeding or swollen lymph nodes.  ALLERGY AND IMMUNOLOGIC: Denies seasonal allergies or history of immunodeficiency.   PHYSICAL EXAMINATION VITAL SIGNS: Temperature 98.8, pulse 85, respirations 22, blood pressure 100/52.  GENERAL: Well-developed, well-nourished, obese Caucasian female who appears her stated age, currently in no acute distress.  HEENT: Normocephalic, atraumatic. Extraocular movements are intact. Pupils equal, round, reactive to light. No scleral icterus. Conjunctivae are pink. No epistaxis noted. Gross hearing intact. Oral mucosa are dry.  NECK: Supple and without JVD or lymphadenopathy.  LUNGS: Demonstrated diminished breath sounds. No rales heard on exam. Normal respiratory effort.  CARDIOVASCULAR:  S1, S2, regular rate and rhythm. A 2 out of 6 systolic ejection murmur heard.  ABDOMEN: Obese, soft, nontender, nondistended. Bowel sounds positive. No rebound or guarding. No gross organomegaly appreciated.  EXTREMITIES: No clubbing, cyanosis, edema.  NEUROLOGIC: The patient is alert and oriented to time, person and place. Strength is 5 out of 5 in both upper and lower extremities.  SKIN: Warm and dry. No rashes noted.  MUSCULOSKELETAL: No joint redness, swelling or tenderness appreciated.  PSYCHIATRIC: The patient has an appropriate affect and appears to have good insight into her current illness.   LABORATORY DATA: Sodium 136, potassium 4.8, chloride 98, CO2 35, BUN 52, creatinine 2.06, glucose 159. Troponin 0.25. CBC shows WBC 8.3, hemoglobin 8.1, hematocrit 25, platelets 248. Blood cultures x 2 sets are negative. Urine culture from December 01, 2012, showed gram-negative rod, however, only 15,000 colony-forming units were noted. Urinalysis from July 4th showed greater than 30 RBCs per high-power field and greater than 30 WBCs per high-power field, trace  proteinuria was noted.   IMPRESSION: This is an 81 year old Caucasian female with past medical history of right breast cancer, status post radiation therapy, followed by Dr. Donella Stade and Dr. Grayland Ormond; chronic obstructive pulmonary disease, hypertension, diabetes mellitus, depression, osteoporosis, diabetic retinopathy, chronic kidney disease stage 3, baseline eGFR 51; who presented to [facility] with progressive shortness of breath.  1.  Acute renal failure/chronic kidney disease stage 3. As above, the patient's baseline eGFR is 51. The patient's renal function is significantly worse now as creatinine is 2.06 with an eGFR of 21. It does not appear that the patient is in florid pulmonary edema at present. Her echocardiogram showed actually showed good ejection fraction. Therefore, I will discontinue Lasix and start the patient on 0.9 normal saline at 60 mL per hour. Her serum bicarbonate is also elevated from 30 to 35, indicating volume depletion. We will obtain renal ultrasound to exclude hydronephrosis. We will also check SPEP, UPEP, ANA, ANCA antibodies, GBM antibodies, C3 and C4. No indication for dialysis at present. Avoid nephrotoxins as possible as well as contrast. We will continue to monitor the patient's renal function closely.  2.  Anemia not otherwise specified. Hemoglobin usually is in the 10s. Hemoglobin was 7.7 upon admission and is now up to 8.1 after transfusion. Gastrointestinal work-up has been obtained. Would hold off on Procrit at present.  3.  Chronic obstructive pulmonary disease exacerbation. This is the most likely source of her shortness of breath as well as ongoing anemia. The patient is receiving steroid therapy, Advair, Spiriva and Xopenex nebulizers.   I would like to thank Dr. Posey Pronto for this kind referral. Further plan as the patient progresses.  ____________________________ Tama High, MD mnl:cs D: 12/05/2012 14:40:51 ET T: 12/05/2012 15:08:05  ET JOB#: [id number]  cc: Tama High, MD, <Dictator> Mariah Milling Itzel Mckibbin MD ELECTRONICALLY SIGNED 01/01/2013 11:20

## 2014-09-20 NOTE — Consult Note (Signed)
Chief Complaint:  Subjective/Chief Complaint Please see full GI consult.  81 yo female with whom I am familiar, admitted with progressive shortess of breath and found to be anemia.  Patietn denies any GI sx.  However does have h/o colon polyps and barretts esophagus.  last Colonoscopy 2012, single diminutive polyp, last egd 08/2006,  has continued on ppi since then.  Patietn has problems with constipation, and takes an oral dose of mineral oil every night before bedtime.  This in the setting of progressive sob raises a question of possible mineral oil pneumonitis in addition to the current issue with anemia, with heme positive stool,  and CHF.  Recommend continuing current tx for chf, consult Dr Meredeth IdeFleming (her o/p pulmonologist). Will consider further GI evaluation once CHF exacerbation is improved, though she will likely remain high risk for sedated proceedure.  Following.   VITAL SIGNS/ANCILLARY NOTES: **Vital Signs.:   07-Jul-14 19:22  Vital Signs Type Admission  Temperature Temperature (F) 99.4  Celsius 37.4  Temperature Source oral  Pulse Pulse 98  Respirations Respirations 22  Systolic BP Systolic BP 130  Diastolic BP (mmHg) Diastolic BP (mmHg) 56  Mean BP 80  Pulse Ox % Pulse Ox % 92  Pulse Ox Activity Level  At rest  Oxygen Delivery 4L   Electronic Signatures: Barnetta ChapelSkulskie, Martin (MD)  (Signed 07-Jul-14 19:34)  Authored: Chief Complaint, VITAL SIGNS/ANCILLARY NOTES   Last Updated: 07-Jul-14 19:34 by Barnetta ChapelSkulskie, Martin (MD)

## 2014-09-20 NOTE — H&P (Signed)
PATIENT NAME:  Elizabeth [name], Elizabeth [name] MR#:  [id number] DATE OF BIRTH:  [birth year]  DATE OF ADMISSION:  12/04/2012  PRIMARY CARE PHYSICIAN: Dr. Dale Erwin.   CHIEF COMPLAINT: Shortness of breath.   HISTORY OF PRESENT ILLNESS: This is an 81 year old female who presents to the hospital complaining of shortness of breath getting worse over the past couple of days. She describes her symptoms as her nose being somewhat stuffy and her not being able take a good, deep breath. She says she has been fighting a sinus infection now for the past couple of weeks. She actually  came to Urgent Care. Was diagnosed a urinary tract infection on July 4th and discharged on oral Septra.   Clinically, she has not been feeling any better as the shortness of breath has gotten worse. She therefore came to the ER for further evaluation. The patient denies any paroxysmal nocturnal dyspnea, any orthopnea, any peripheral edema, any weight gain. She admits to a cough, productive, with clear sputum. No nausea, no vomiting. No chest pain. No abdominal pain. No diarrhea. No melena or hematochezia. No other associated symptoms presently.   Given her worsening shortness of breath and her chest x-ray findings suggestive of pulmonary edema and congestive heart failure, hospitalist services were contacted for further treatment and evaluation.   REVIEW OF SYSTEMS:  CONSTITUTIONAL: No documented fever. No weight gain. No weight loss.  EYES: No blurred or double vision.  ENT: No tinnitus, no postnasal drip, no redness of the oropharynx.  RESPIRATORY: Positive cough. No wheeze, no hemoptysis. Positive dyspnea. Positive COPD.  CARDIOVASCULAR: Chest pain, no orthopnea, no palpitations, no syncope.  GASTROINTESTINAL: No nausea. No vomiting, no diarrhea. No abdominal pain, no melena or hematochezia.  GENITOURINARY: No dysuria or hematuria.  ENDOCRINE: No polyuria or nocturia. No heat or cold intolerance.  HEMATOLOGIC: Positive anemia. No acute  bruising or bleeding.  INTEGUMENTARY: No rashes. No lesions.   MUSCULOSKELETAL: No arthritis. No swelling. No gout.  NEUROLOGIC: No numbness or tingling. No ataxia. No seizure activity.  PSYCHIATRIC: Positive depression. No anxiety. No ADD.   PAST MEDICAL HISTORY: Consistent with history of breast cancer, COPD, hypertension, diabetes, depression, osteoporosis.   ALLERGIES: AUGMENTIN, which causes nausea, vomiting. CIPROFLOXACIN, which causes cough. DOXYCYCLINE, causes nausea, vomiting. ERYTHROMYCIN, causes GI distress. GLUCOPHAGE, causes GI distress. JANUVIA, causes GI distress. METFORMIN also causes GI distress. ZITHROMAX causes shortness of breath. WELCHOL causes GI distress.   SOCIAL HISTORY: Used to be a smoker of about 20 pack-years. Quit 20+ years ago. No alcohol abuse. No illicit drug abuse. Lives by herself.   FAMILY HISTORY: Mother died from complications of congestive heart failure. Father died from complications of a sinus malignancy.   CURRENT MEDICATIONS: Tylenol, extended-release 650 mg, 2 tablets every 8 hours as needed, Actos 45 mg daily, Advair 250/50 1 puff b.i.d., Fosamax 70 mg 1 tablet weekly, amlodipine 2.5 mg at bedtime, bupropion 100 mg daily, calcium/vitamin D 1 tablet b.i.d., doxepin 25 mg at bedtime, Effexor 75 mg daily, fenofibrate 160 mg at bedtime, Flonase 2 sprays to each nostril daily, Letrozole 2.5 mg daily, lisinopril 40 mg daily, Mucinex 600 mg b.i.d. as needed, NovoLog FlexPen 5 units with breakfast, 6 units with lunch, 8 units with supper, Protonix 40 mg daily, PreserVision 1 tablet b.i.d., Spiriva 1 puff daily, and Bactrim double-strength 1 tablet b.i.d.   PHYSICAL EXAMINATION ON ADMISSION:  VITAL SIGNS: Are noted to be temperature is 99.8, pulse 89, respirations 26, blood pressure 92/48, sats 92% on 2 to 3  liters nasal cannula.  GENERAL: She is a pleasant-appearing female in mild to moderate respiratory distress.  HEENT: Atraumatic, normocephalic. Extraocular  muscles are intact. Pupils are equal, reactive to light. Sclerae are anicteric. No conjunctival injection. No pharyngeal erythema.  NECK: Supple. There is no jugular venous distention, no bruits, no lymphadenopathy, no thyromegaly.  HEART EXAM: Regular rate and rhythm. She does have a 2/6 systolic ejection murmur heard at the right sternal border. No rubs. No clicks.   LUNGS: She has positive use of accessory muscles. No dullness to percussion. Diffuse end-expiratory wheezing, with some minimal rhonchi. No rales. Prolonged inspiratory and expiratory phase.  ABDOMEN: Soft, flat, nontender, nondistended. Has good bowel sounds. No hepatosplenomegaly appreciated.  EXTREMITIES: No evidence of any cyanosis, clubbing, or peripheral edema. Has +2 pedal and radial pulses bilaterally.  NEUROLOGICAL: The patient is alert, awake, and oriented x 3, with no focal motor or sensory deficits appreciated bilaterally.  SKIN: Moist and warm, with no rashes appreciated.  LYMPHATIC: There is no cervical or axillary lymphadenopathy.   LABORATORY EXAM: Showed a serum glucose of 160, BUN 38, creatinine 1.7, sodium 137, potassium 4.7, chloride 101, bicarb 30, troponin 0.2. White cell count 11.2, hemoglobin 7.7, hematocrit 23.9, platelet count 222.   The patient did have a chest x-ray done which showed continued cardiomegaly with changes of pulmonary edema, likely secondary to congestive heart failure.   ASSESSMENT AND PLAN: This is an 81 year old female with a history of breast cancer, chronic obstructive pulmonary disease, hypertension, diabetes, depression, osteoporosis, presents to the hospital due to shortness of breath.   1.  Shortness of breath: The exact etiology of this is unclear, but likely related to a combination of underlying chronic obstructive pulmonary disease exacerbation, also decompensated congestive heart failure, given the chest x-ray findings. I will gently diurese the patient with IV Lasix, follow ins  and outs and daily weights. Will get a 2-dimensional echocardiogram.   Also treat the patient with IV steroids for her chronic obstructive pulmonary disease, along with continuing her Advair and Spiriva and some p.r.n. DuoNebs and follow her clinically.   2.  Chronic obstructive pulmonary disease exacerbation: This is likely secondary to acute bronchitis: I will placed her on some IV steroids, continue Advair, Spiriva, and empiric oral Levaquin. She is already on home O2 at 3 liters chronically.   3.  Decompensated congestive heart failure: Questionable if this is systolic versus diastolic as I do not have an echocardiogram to compare with. I will gently diurese her with IV Lasix, follow ins and outs and daily weights, continue her ACE inhibitor and get a 2-dimensional echocardiogram.  4.  Hypertension: Continue Norvasc and lisinopril.  5.  Diabetes: Continue NovoLog with meals and sliding-scale insulin coverage, and a carb-controlled diet, and follow blood sugars.  6.  Depression: Continue Effexor. 7. Gastroesophageal reflux disease: Continue Protonix.  8. Anemia: The patient's hemoglobin has dropped from 10 to 7 over the past 3 months. She denies any hematochezia or melena. She did have a rectal exam done by the ER physician that was heme-positive. Will transfuse her 1 unit of packed red blood cells. Will get a gastroenterology consult. I will check iron, folate, B12, ferritin and a reticulocyte count and follow her serial hemoglobins.  9.  Acute renal failure: The patient's creatinine is up to 1.7. Questionable if this is underlying congestive heart failure or even volume loss from her anemia. I will go ahead and diuresis her with IV Lasix and also give her 1  unit of packed red blood cells, follow her BUN and creatinine and urine output. Continue ACE inhibitor for now. If needed, will consider getting nephrology involved.   THE PATIENT IS A FULL CODE.   Time spent on admission is 50 minutes.     ____________________________ Rolly Pancake. Cherlynn Kaiser, MD vjs:dm D: 12/04/2012 14:35:39 ET T: 12/04/2012 14:56:38 ET JOB#: [id number]  cc: Rolly Pancake. Cherlynn Kaiser, MD, <Dictator> Houston Siren MD ELECTRONICALLY SIGNED 12/04/2012 19:43

## 2014-09-20 NOTE — Consult Note (Signed)
Chief Complaint:  Subjective/Chief Complaint seen for anemia and heme positive stool, admitted with copd exacerbation.  feeling some better in regard to breathing.  appetite good, having more regular bm with miralax.   VITAL SIGNS/ANCILLARY NOTES: **Vital Signs.:   10-Jul-14 15:53  Vital Signs Type Routine  Temperature Temperature (F) 98.2  Celsius 36.7  Temperature Source oral  Pulse Pulse 82  Respirations Respirations 20  Systolic BP Systolic BP 569  Diastolic BP (mmHg) Diastolic BP (mmHg) 72  Mean BP 99  Pulse Ox % Pulse Ox % 95  Pulse Ox Activity Level  At rest  Oxygen Delivery 4L   Brief Assessment:  Cardiac Regular   Respiratory clear BS   Gastrointestinal details normal Soft  Nontender  Nondistended  Bowel sounds normal  protuberant   Lab Results: Routine Chem:  10-Jul-14 04:08   Glucose, Serum 83  BUN  69  Creatinine (comp)  1.78  Sodium, Serum 137  Potassium, Serum 4.7  Chloride, Serum  96  CO2, Serum  35  Calcium (Total), Serum 9.8  Anion Gap  6  Osmolality (calc) 293  eGFR (African American)  30  eGFR (Non-African American)  26 (eGFR values <51m/min/1.73 m2 may be an indication of chronic kidney disease (CKD). Calculated eGFR is useful in patients with stable renal function. The eGFR calculation will not be reliable in acutely ill patients when serum creatinine is changing rapidly. It is not useful in  patients on dialysis. The eGFR calculation may not be applicable to patients at the low and high extremes of body sizes, pregnant women, and vegetarians.)  Routine Hem:  10-Jul-14 04:08   WBC (CBC) 9.7  RBC (CBC)  3.19  Hemoglobin (CBC)  9.1  Hematocrit (CBC)  27.4  Platelet Count (CBC) 305  MCV 86  MCH 28.4  MCHC 33.0  RDW  15.0  Neutrophil % 76.7  Lymphocyte % 11.2  Monocyte % 11.5  Eosinophil % 0.5  Basophil % 0.1  Neutrophil #  7.4  Lymphocyte # 1.1  Monocyte #  1.1  Eosinophil # 0.1  Basophil # 0.0 (Result(s) reported on 07 Dec 2012  at 05:14AM.)   Assessment/Plan:  Assessment/Plan:  Assessment 1) anemia, heme positive stool.  hgb above 9.   2) multiple comorbidities, improving respiratory status.   Plan 1) discussed with Dr PPosey Prontoyesterday, will hold GI luminal evaluation  for now, recommend GI follow up in 2 weeks, reevaluate status and schedule proceedures at that time.  discussed with patient and patients family, they are in agreement.   Continue daily ppi and daily miralax.  I have discuuraged the use of mineral oil po for a laxative.   Electronic Signatures: SLoistine Simas(MD)  (Signed 10-Jul-14 17:05)  Authored: Chief Complaint, VITAL SIGNS/ANCILLARY NOTES, Brief Assessment, Lab Results, Assessment/Plan   Last Updated: 10-Jul-14 17:05 by SLoistine Simas(MD)

## 2014-09-21 NOTE — H&P (Signed)
PATIENT NAME:  Elizabeth PurserBUNN, Monifa J MR#:  [id number] DATE OF BIRTH:  Jul 26, 1925  DATE OF ADMISSION:  09/01/2013  PRIMARY CARE PHYSICIAN:  Dr. Olegario Shearerharles Scott.  PRIMARY CARDIOLOGIST:  Dr. Darrold JunkerParaschos.   PULMONOLOGY:  Dr. Meredeth IdeFleming.   REFERRING EMERGENCY ROOM PHYSICIAN:  Dr. Margarita GrizzleWoodruff.   CHIEF COMPLAINT:  Shortness of breath and chest tightness.   HISTORY OF PRESENT ILLNESS:  The patient is an 81 year old female with past medical history of diastolic congestive heart failure, COPD, lives on CPAP at bedtime, on 3 liters of oxygen via nasal cannula during the daytime, hypertension, diabetes mellitus and breast cancer is presenting to the ER with a chief complaint of worsening of shortness of breath for the past three days.  The patient usually sats at 94% to 96% on 3 liters of oxygen as reported by the daughter, but for the past 3 or 4 days the patient's sats are at 90% to 91% while resting on 3 liters of oxygen and with minimal exertion or with ambulation they are dropping down to 78% to 80%.  Daughter has increased her oxygen via nasal cannula to 3.5 liters and subsequently to 4 liters.  The patient has gained approximately 5 pounds in the past few days.  She is complaining of tightness in her chest associated with nausea and dizziness.  She has persistent postnasal drip from the seasonal allergies.  She sees Dr. Darrold JunkerParaschos as an outpatient.  As the patient's shortness of breath is getting worse, she was unable to lie flat for the past two days she is resting in a recliner.  EMS is called and the patient is brought into the ER.  The patient was placed on BiPAP.  Lasix IV was given.  Chest x-ray has revealed pulmonary edema and creatinine is at 1.43.  Initial troponin is at 0.07.  According to the patient's previous records her troponin usually runs at around 0.08 to 0.30.  In other words, the patient has chronically elevated troponin as her baseline.  As the patient was feeling tight in her chest she was given DuoNeb  neb treatments as well.  A 12-lead EKG did not reveal any acute ST-T wave changes.  Hospitalist team is called to admit the patient for pulmonary edema.  During my examination the patient is sating at 92% to 93% on BiPAP, but she is feeling dry and feeling uncomfortable on BiPAP mask.  Daughter and son-in-law are at bedside.  The patient denies any loss of consciousness.  No fevers.  No sick contacts either.   PAST MEDICAL HISTORY:  Chronic renal insufficiency, chronic anemia from chronic renal insufficiency, diabetes mellitus, hypertension, diastolic congestive heart failure, COPD, history of breast cancer, depression, osteoporosis, GERD.   PAST SURGICAL HISTORY:  Hysterectomy with oophorectomy.   ALLERGIES:  AUGMENTIN, CIPRO, DOXYCYCLINE, ERYTHROMYCIN, GLUCOPHAGE, JANUVIA, METFORMIN, WELCHOL, ZITHROMAX.   PSYCHOSOCIAL HISTORY:  Lives at home with her roommate.  Used to smoke, but quit smoking several years ago.  Lives on BiPAP at bedtime, lives on 3 liters of oxygen via nasal cannula during daytime.  No history of alcohol or illicit drug usage.   FAMILY HISTORY:  Mother had a history of hypertension.  Denies any history of diabetes mellitus.   REVIEW OF SYSTEMS: CONSTITUTIONAL:  Denies any fever.  Complaining of fatigue and weakness.  EYES:  Denies blurry vision, double vision, glaucoma.  EARS, NOSE, THROAT:  Denies epistaxis or discharge.  Has a persistent postnasal drip from seasonal allergies.  RESPIRATION:  Denies cough, but complaining  of dyspnea, PND.  Has chronic history of COPD.  CARDIOVASCULAR:  Complaining of chest tightness, but no chest pain.  Denies palpitations.  Complaining of dizziness.  No syncope.  GASTROINTESTINAL:  No nausea.  Denies vomiting, diarrhea, abdominal pain, hematemesis.  GENITOURINARY:  No dysuria, hematuria.  GYNECOLOGIC AND BREAST:  Denies breast mass or vaginal discharge.  Status post hysterectomy.  ENDOCRINE:  Denies polyuria, nocturia.  Has history of  diabetes mellitus.  HEMATOLOGIC AND LYMPHATIC:  No anemia, easy bruising, bleeding.  INTEGUMENTARY:  No acne, rash, lesions.  MUSCULOSKELETAL:  No joint pain in the neck and back.  Denies gout.  NEUROLOGIC:  Denies vertigo or ataxia.  PSYCHIATRIC:  No ADD, OCD.   PHYSICAL EXAMINATION: VITAL SIGNS:  Temperature 98, pulse 90, respirations 30, blood pressure 163/71.  GENERAL APPEARANCE:  Not under acute distress.  Moderately built and nourished, on BiPAP machine.  HEENT:  Normocephalic, atraumatic.  Pupils are equal, reacting to light and accommodation.  No scleral icterus.  No conjunctival injection.  Positive postnasal drip.  No sinus tenderness; on BiPAP mask.  NECK:  Supple.  Positive JVD.  No thyromegaly.  Range of motion is intact.  LUNGS:  Diminished breath sounds, positive rales.  CARDIOVASCULAR:  Irregularly irregular.  GASTROINTESTINAL:  Soft.  Bowel sounds are positive in all four quadrants.  Nontender, nondistended.  No hepatosplenomegaly.  No masses felt.  NEUROLOGIC:  Awake, alert, oriented x 3, uncomfortable on the BiPAP mask.  Motor and sensory are intact.  Reflexes are 2+.  EXTREMITIES: Trace edema is  present.  No cyanosis.  No clubbing.  SKIN:  Warm to touch.  Normal turgor.  No rashes.  No lesions.  MUSCULOSKELETAL:  No joint effusion, tenderness, erythema.  PSYCHIATRIC:  Flat mood and affect.   LABORATORY AND IMAGING STUDIES:  Accu-Chek is 173, glucose 67, BNP 2921.  BUN 40, creatinine 1.43, sodium 141, potassium 4.5, chloride 102, CO2 37, anion gap 2.  Serum osmolality 289, calcium 9.1.  Troponin 0.07.  LFTs are normal.  WBC 9.7, hemoglobin 8.4, hematocrit 27.7, platelets are 273.  EKG sinus tachycardia at 105, ST abnormalities in the inferior leads.  Portable chest x-ray, evidence of a degree of congestive heart failure superimposed on emphysematous changes, no consolidation.  DICTATION ENDS HERE   ____________________________ Ramonita Lab, MD ag:ea D: 09/01/2013  23:09:14 ET T: 09/01/2013 23:42:01 ET JOB#: [id number]  cc: Ramonita Lab, MD, <Dictator> Ramonita Lab MD ELECTRONICALLY SIGNED 09/20/2013 7:05

## 2014-09-21 NOTE — Discharge Summary (Signed)
PATIENT NAME:  Laymond PurserBUNN, Inaara J MR#:  [id number] DATE OF BIRTH:  01-15-1926  DATE OF ADMISSION:  06/24/2013 DATE OF DISCHARGE:  06/27/2013  DISCHARGE DIAGNOSES: 1. Acute on chronic respiratory failure.  2. Acute chronic obstructive pulmonary disease exacerbation.  3. Acute diastolic congestive heart failure.  4. Hypertension.  5. Acute worsening of renal function.  6. Anemia of chronic disease.  7. Atrial fibrillation, not a candidate for anticoagulant. Rate controlled with Cardizem.   MEDICATIONS ON DISCHARGE: 1. NovoLog 4 units at breakfast and 6 units at lunch and 8 units supper. 2. Mucinex 600 mg oral extended-release every 12 hours as needed.  3. Pantoprazole 40 mg delayed-release once a day.  4. Alendronate 70 mg tablet once a week.  5. Albuterol inhalation as needed.  6. Lorazepam 0.5 mg oral tablet, take 1/2 tablet 4 times a day as needed for anxiety and nervousness.  7. Prednisone 10 mg oral tablet start 60 and taper by 10 until complete.  8. Fluconazole 200 mg oral tablet once a day for seven days for yeast infection.  9. Spiriva 18 mcg once a day.  10. Cardizem 240 mg oral 24-hour extended release once a day.  11. Ceftin 500 mg oral tablet 3 times a day for three days.  12. Furosemide 20 mg oral, take 1/2 tablet once a day.  13. Megestrol 20 mg oral tablet once a day.  14. Fluticasone nasal spray 2 times a day.   HOME HEALTH ON DISCHARGE: Yes. Advised to have physical therapy and nurse with 2 liters nasal cannula oxygen supplementation and low-sodium, carbonate -controlled ADA diet.  Advised to follow with Dr. Philemon KingdomKowalski's office in 1 to 2 weeks for atrial fibrillation.   HISTORY OF PRESENTING ILLNESS: The patient is an 81 year old female with past medical history of chronic obstructive pulmonary disease, congestive heart failure, diabetes, hypertension and breast cancer with a chief complaint of not feeling well. The patient has been sick for the last two weeks, and seen by  primary care physician regarding worsening of her renal function, was feeling short of breath for the past one week. Primary care physician started her on p.o. prednisone and levofloxacin. She was taking the medication but shortness of breath was getting more and was feeling extremely weak, trying to get out of the bed to go to the bathroom, but she slid off and fell on the floor and could not get up. Chest x-ray did not reveal any new infiltrate or pneumonia. The patient was given IV ceftriaxone and breathing treatment and  was called for admission.   HOSPITAL COURSE AND STAY:  Acute on chronic respiratory failure present on admission. This was due to COPD exacerbation requiring 2 to 3 liters of oxygen at home. She was started on IV steroid, nebulizer and Dr. Meredeth IdeFleming was following her as outpatient. He also added Spiriva   home medication. The patient was feeling better and we discharged her home.   OTHER MEDICAL ISSUES: 1. Acute on chronic diastolic CHF, which was present on admission and started on Lasix.  2. Acute renal failure over chronic kidney disease, improving. Baseline was 1.2 to 1.3 creatinine.  3. Hypertension, which was well-controlled. We held lisinopril. There was no worsening in creatinine at the time of discharge.  4. Diabetes. We continued NovoLog with meal and carb controlled diet.  5. Anemia of chronic disease. It was stable, baseline was around 8.  6. Elevated troponin due to demand ischemia.  7. Atrial fibrillation. She was not a  candidate for anticoagulation due to old age and weakness. Rate control was achieved by Cardizem. Heart rate remained under control. Advised to follow with Dr. Philemon Kingdom office for further management of her atrial fibrillation.   CONSULT IN THE HOSPITAL: For cardiology, Dr. Gwen Pounds.   IMPORTANT LABORATORY RESULTS: WBC count on presentation 13.3, hemoglobin 9.1. Troponin 0.15, BUN 31, creatinine 1.55. Blood culture was negative. On ABG, pH was 7.42,  pCO2 was 47, and pO2 was 122. Creatinine went up to 31.38 and later on 1.40.   Echocardiogram: Ejection fraction 55% to 60%, normal global left ventricular systolic function, mild dilated left atrium, mildly dilated right atrium, mildly elevated pulmonary artery systolic pressure, mild increased left ventricular posterior wall thickness.   TOTAL TIME SPENT ON THIS DISCHARGE: 40 minutes.  ____________________________ Hope Pigeon Elisabeth Pigeon, MD vgv:sg D: 07/01/2013 23:55:49 ET T: 07/02/2013 08:39:31 ET JOB#: [id number]  cc: Hope Pigeon. Elisabeth Pigeon, MD, <Dictator> Lamar Blinks, MD  Altamese Dilling MD ELECTRONICALLY SIGNED 07/05/2013 23:58

## 2014-09-21 NOTE — Discharge Summary (Signed)
Dates of Admission and Diagnosis:  Date of Admission 10-Apr-2014   Date of Discharge 14-Apr-2014   Admitting Diagnosis acute on chronic respi failure   Final Diagnosis acute on chronic respiratory failure Acute diastolic heart failure Chronic obstructive respiratory disease Hypertension.    Chief Complaint/History of Present Illness an 81 year old female who has history of chronic renal insufficiency, chronic anemia, diabetes, hypertension, diastolic congestive heart failure, COPD,  and chronic respiratory failure and oxygen use of 3 liters at home with CPAP requirement at night, has gastroesophageal reflux disease. She was requiring increased oxygen at home and still was not feeling comfortable, was feeling extremely short of breath on exertion, so called ambulance and EMS noticed her oxygen saturation to be 50% on room air, on 4 liters supplementation it came up to more than 90, and so brought her to the Emergency Room. In the ER, on x-ray found having pulmonary edema. BNP was elevated. She was started on BiPAP and she is feeling better given Lasix injection, so given to hospitalist team for further management.   Allergies:  Augmentin: N/V  Doxycycline: N/V  Seldane: GI Distress  Erythromycin: GI Distress  Metformin: GI Distress  Welchol: GI Distress  Glucophage: GI Distress  Zithromax: SOB  Cipro: Cough  Januvia: GI Distress  Cardiology:  13-Nov-15 09:03   Echo Doppler REASON FOR EXAM:     COMMENTS:     [facility]  - Apr 12 2014  9:03AM   RESULT: Echocardiogram Report  Patient Name:   Elizabeth [name] Date of Exam: 04/12/2014 Medical Rec #:  [id number]        Custom1: Date of Birth:  1925-06-05     Height:       63.0 in Patient Age:    81 years      Weight:       191.8 lb Patient Gender: F             BSA:          1.90 m??  Indications: CHF Sonographer:    Cristela BlueJerry Hege RDCS Referring Phys: Altamese DillingVACHHANI, Millissa Deese  Sonographer Comments:  Suboptimal apical window and COPD.  Summary:  1. Left ventricular ejection fraction, by visual estimation, is 70 to  75%.  2. Concentric left ventricular hypertrophy.  3. Impaired relaxation pattern of LV diastolic filling. 4. Moderately increased left ventricular septal thickness.  5. Decreased left ventricular internal cavity size.  6. Mildly dilated left atrium.  7. Mildly dilated right atrium.  8. Mild mitral valve regurgitation.  9. Mild aortic valve stenosis. 10. Mildly elevated pulmonary artery systolic pressure. 11. Moderately increased left ventricular posterior wall thickness. 12. Mild tricuspid regurgitation. 2D AND M-MODE MEASUREMENTS (normal ranges within parentheses): Left Ventricle:Normal IVSd (2D):      1.65 cm (0.7-1.1) LVPWd (2D):     1.79 cm (0.7-1.1) Aorta/LA:                  Normal LVIDd (2D):     3.50 cm (3.4-5.7) Aortic Root (2D): 3.20 cm (2.4-3.7) LVIDs (2D):     1.99 cm           Left Atrium (2D): 4.30 cm (1.9-4.0) LV FS (2D):     43.1 %   (>25%) LV EF (2D):     75.3 %   (>50%)  Right Ventricle:                                   RVd (2D):        2.53 cm LV DIASTOLIC FUNCTION: MV Peak E: 1.61 m/s E/e' Ratio: 51.80 MV Peak A: 1.69 m/s Decel Time: 246 msec E/A Ratio: 1.20 SPECTRAL DOPPLER ANALYSIS (where applicable): Mitral Valve: MV Max Vel:   2.19 m/s  MV P1/2 Time: 71.34 msec MV Mean Grad: 10.0 mmHg MV Area, PHT: 3.08 cm?? Aortic Valve: AoV Max Vel: 2.68 m/s AoV Peak PG: 28.7 mmHg AoV Mean PG:  15.0 mmHg LVOT Vmax: 1.27 m/s LVOT VTI: 0.302 m LVOT Diameter: 2.00 cm AoV Area, Vmax: 1.49 cm?? AoV Area, VTI: 1.82 cm?? AoV Area, Vmn: 1.57 cm?? Tricuspid Valve and PA/RV Systolic Pressure: TR Max Velocity: 3.14 m/s RA  Pressure: 5 mmHg RVSP/PASP: 44.4 mmHg Pulmonic Valve: PV Max Velocity: 1.37 m/s PV Max PG: 7.5 mmHg PV Mean PG: PHYSICIAN INTERPRETATION: Left Ventricle: The left ventricular internal cavity size was  decreased.  LV septal wall thickness was moderately increased. LV posterior wall  thickness was moderately increased. Concentric left ventricular  hypertrophy. Left ventricular ejection fraction, by visual estimation, is  70 to 75%. Spectral Doppler shows impaired relaxation pattern ofLV  diastolic filling. Right Ventricle: The right ventricular size is mildly enlarged. Global RV  systolic function is normal. Left Atrium: The left atrium is mildly dilated. Right Atrium: The right atrium is mildly dilated. Pericardium: There is no evidence of pericardial effusion. Mitral Valve: The mitral valve is normal in structure. Mild mitral valve  regurgitation is seen. Tricuspid Valve: The tricuspid valve is normal. Mild tricuspid   regurgitation is visualized. The tricuspid regurgitant velocity is 3.14  m/s, and with an assumed right atrial pressure of 5 mmHg, the estimated  right ventricular systolic pressure is mildly elevated at 44.4 mmHg. Aortic Valve: The aortic valve is normal. Mild aortic stenosis is  present. No evidence of aortic valve regurgitation is seen. Pulmonic Valve: The pulmonic valve is normal.  [id number] Dwayne Callwood MD Electronically signed by [id number] Dorothyann Peng MD Signature Date/Time: 04/13/2014/10:56:52 AM  *** Final ***  IMPRESSION:.  Verified By: Alwyn Pea, M.D., MD  Routine Chem:  11-Nov-15 12:23   B-Type Natriuretic Peptide [facility])  1453 (Result(s) reported on 10 Apr 2014 at 12:50PM.)   Pertinent Past History:  Pertinent Past History 1.  Had renal insufficiency in the past, but lately her renal function has been normal with GFR more than 60.  2.  Diabetes.  3.  Hypertension.  4.  Diastolic congestive heart failure.  5.  COPD. 6.  History of breast cancer.  7.  Depression.  8.  Osteoporosis.  9.  Gastroesophageal reflux disease.   Hospital Course:  Hospital Course An 81 year old female who has a history of congestive heart failure, chronic  obstructive pulmonary disease on home oxygen use, came to the Emergency Room after found hypoxic by EMS, for the last few days feeling short of breath. No cough.  1.  Acute on chronic respiratory failure due to acute diastolic congestive heart failure.    responded well to IV lasix.     swich to oral lasix- had goood diuresis till now >3 ltr negative volume.   back to baseline 3 ltr oxygen now. 2.  History of chronic obstructive pulmonary disease, home oxygen use. we will  continue Spiriva, Advair and  albuterol nebulizer as needed basis.  no exacerbation. 3.  History of hypertension. Cardizem, lisinopril and metoprolol and on Lasix. 4. DM- ISS   Condition on Discharge Stable   DISCHARGE INSTRUCTIONS HOME MEDS:  Medication Reconciliation: Patient's Home Medications at Discharge:     Medication Instructions  spiriva 18 mcg inhalation capsule  1 each inhaled once a day   doxepin 25 mg oral capsule  1 cap(s) orally once a day (at bedtime)   lisinopril 40 mg oral tablet  1 tab(s) orally once a day   fenofibrate 160 mg oral tablet  1 tab(s) orally once a day   alendronate 70 mg oral tablet  1 tab(s) orally once a week on Wednesday   aspirin enteric coated 81 mg oral delayed release tablet  1 tab(s) orally once a day   symbicort 160 mcg-4.5 mcg/inh inhalation aerosol  2 puff(s) inhaled 2 times a day   bupropion 100 mg/12 hours oral tablet, extended release  1 tab(s) orally once a day   diltiazem hydrochloride cd 180 mg/24 hours oral capsule, extended release  1 cap(s) orally once a day   fluticasone nasal 50 mcg/inh nasal spray  2 spray(s) nasal once a day   guaifenesin 600 mg oral tablet, extended release  1 tab(s) orally every 12 hours, As Needed for cough   albuterol-ipratropium cfc free 100 mcg-20 mcg/inh inhalation aerosol  2 puff(s) inhaled 4 times a day   letrozole 2.5 mg oral tablet  1 tab(s) orally once a day   levalbuterol 0.63 mg/3 ml inhalation solution  3 milliliter(s) inhaled  every 6 hours, As Needed - for Wheezing   metoprolol succinate 50 mg oral tablet, extended release  1 tab(s) orally once a day   preservision areds 2 antioxidant multiple vitamins and minerals oral capsule  1 cap(s) orally once a day   protonix 40 mg oral delayed release tablet  1 tab(s) orally once a day   venlafaxine 75 mg oral tablet  1 tab(s) orally once a day   furosemide 20 mg oral tablet  1 tab(s) orally once a day   levothyroxine 50 mcg (0.05 mg) oral tablet  1 tab(s) orally once a day   humalog kwikpen 100 units/ml subcutaneous solution  8 unit(s) subcutaneous 2 times a day with breakfast and dinner   polyethylene glycol 3350 oral powder for reconstitution  17 gram(s) orally once a day, As needed, constipation    STOP TAKING THE FOLLOWING MEDICATION(S):    roflumilast 500 mcg oral tablet: 0.5 tab(s) orally every other day  Physician's Instructions:  Home Oxygen? Yes   Oxygen delivery at home: 3L  Nasal Cannula  cpap at night.   Diet Low Sodium  Carbohydrate Controlled (ADA) Diet   Activity Limitations As tolerated   Return to Work Not Applicable   Time frame for Follow Up Appointment 1-2 weeks  PMD     Lorin Picket, Charlene(Family Physician): [facility] [place], [address], [address], [place], [place] [address], [place] [phone]  TIME SPENT:  Total Time: Greater than 30 minutes   Electronic Signatures: Altamese Dilling (MD)  (Signed [phone] 15:08)  Authored: ADMISSION DATE AND DIAGNOSIS, CHIEF COMPLAINT/HPI, Allergies, PERTINENT LABS, PERTINENT PAST HISTORY, HOSPITAL COURSE, DISCHARGE INSTRUCTIONS HOME MEDS, PATIENT INSTRUCTIONS, Follow Up Physician, TIME SPENT   Last Updated: 15-Nov-15 15:08 by Altamese Dilling (MD)

## 2014-09-21 NOTE — Consult Note (Signed)
PATIENT NAME:  Elizabeth [name], Elizabeth [name] MR#:  [id number] DATE OF BIRTH:  February 20, 1926  DATE OF CONSULTATION:  09/02/2013  REFERRING PHYSICIAN:   CONSULTING PHYSICIAN:  Marcina Millard, MD  PRIMARY CARE PHYSICIAN: Dale [place], MD  CARDIOLOGIST:  Marcina Millard, MD  PULMONOLOGIST: Meredeth Ide.   CHIEF COMPLAINT: Shortness of breath.   HISTORY OF PRESENT ILLNESS: The patient is an 81 year old female with history of COPD, obstructive sleep apnea on CPAP, hypertension, diabetes and history of diastolic congestive heart failure. The patient apparently has been in her usual state of health until the past several days, when she has been experiencing increasing shortness of breath and fluid retention. She presented to [facility] Emergency Room where she was noted to be hypoxic, was placed on BiPAP, treated with intravenous Lasix. Chest x-ray did reveal pulmonary edema. Upon further questioning, the patient reports that she has not been compliant with a low sodium diet. In fact, she has been eating potato chips. The patient was admitted to telemetry. Admission labs were notable for a borderline elevated troponin 0.07 in the absence of chest pain. BNP was 2921. EKG was nondiagnostic.   PAST MEDICAL HISTORY: 1.  History of diastolic congestive heart failure.  2.  Hypertension.  3.  Diabetes.  4.  Chronic kidney disease.  5.  COPD.   6.  Chronic anemia.  7.  Gastroesophageal reflux disease.   MEDICATIONS:  Prednisone 5 mg daily, pioglitazone 45 mg daily, pantoprazole 40 mg daily, NovoLog FlexPen, Mucinex 600 mg b.i.d., lorazepam 0.5 mg q.i.d. p.r.n., lisinopril 40 mg daily, fenofibrate 160 mg daily, Effexor 75 mg daily, doxepin 25 mg daily, diltiazem 120 mg daily, bupropion 100 mg daily, albuterol 2 puffs q.i.d., Advair 2 puffs q.i.d., acetaminophen 1 tab q.6 p.r.n.   SOCIAL HISTORY: The patient currently lives with a roommate. She quit tobacco use 7 years ago. She currently uses BiPAP at night.   FAMILY  HISTORY: No immediate family history of coronary artery disease or myocardial infarction.   REVIEW OF SYSTEMS:    CONSTITUTIONAL: No fever or chills.  EYES: No blurry vision.  EARS: No hearing loss.  RESPIRATORY: The patient has had a 1 to 2-week history of shortness of breath.  CARDIOVASCULAR: The patient does have intermittent pedal edema.  GASTROINTESTINAL: No nausea, vomiting or diarrhea.  GENITOURINARY: No dysuria or hematuria.  ENDOCRINE: No polyuria or polydipsia.  MUSCULOSKELETAL: No arthralgias or myalgias.  NEUROLOGICAL: No focal muscle weakness or numbness.  PSYCHOLOGICAL: No depression or anxiety.   PHYSICAL EXAMINATION: VITAL SIGNS: Blood pressure 132/52, pulse 102, respirations 20, temperature 98.1, pulse ox 94%.  HEENT: Pupils equal, reactive to light and accommodation.  NECK: Supple without thyromegaly.  LUNGS: Clear.  HEART: Normal JVP. Normal PMI. Regular rate and rhythm. Normal S1, S2. No appreciable gallop, murmur or rub.  ABDOMEN: Soft and nontender.  EXTREMITIES: There is trace to 1+ bilateral pedal edema.  MUSCULOSKELETAL: Normal muscle tone.  NEUROLOGIC: The patient is alert and oriented x 3. Motor and sensory both grossly intact.   IMPRESSION: An 81 year old female with diastolic dysfunction secondary to hypertension, diabetes, obesity as well as dietary noncompliance. The patient has undergone initial diuresis with overall clinical improvement. She has borderline elevated troponin likely due to demand supply ischemia and not due to acute coronary syndrome, particularly in the absence of chest pain or ECG changes.   RECOMMENDATIONS: 1.  Agree with overall current therapy.  2.  Would defer full-dose anticoagulation.  3.  Continue diuresis.  4.  Had a lengthy  discussion with the patient and family members about the importance of dietary compliance with low-sodium diet.  5.  Instructed patient to keep a diary of daily weights.  6.  No further cardiac  noninvasive or invasive evaluation or diagnostics at this time.   ____________________________ Marcina MillardAlexander Halsey Persaud, MD ap:cs D: 09/02/2013 11:57:47 ET T: 09/02/2013 15:08:04 ET JOB#: [id number]  cc: Marcina MillardAlexander Erek Kowal, MD, <Dictator> Marcina MillardALEXANDER Georgean Spainhower MD ELECTRONICALLY SIGNED 10/02/2013 12:43

## 2014-09-21 NOTE — Consult Note (Signed)
General Aspect 81 year old female that was admitted to [facility] after a mechanical fall out of bed secondary to weakness and shortness of breath from acute exacerbation of COPD. Patient waa placed on prednisone and  antibiotics for a couple days before her admission to the hospital without any significant improvement in symptoms.  Patient also has renal insufficiency and chronic anemia from same.  She was much better after receiving some breathing treatments.  Her troponin was mildly elevated initially at 0.15, now at 0.08.  When she was in the hospital in July,  creatinine was elevated  at 0.21.  She currently denies any chest pain.  Is feeling better.  States she wears her CPAP nightly.  Her creatinine on admission was 1.55 with hemoglobin at 9.  Hemoglobin currently is 8.3.  Renal insufficiency has improved Since admission.  Elevation of  troponin is thought to be secondary to demand ischemia from renal sufficiency and acute exacerbation of COPD and not acute coronary syndrome.  Her EKG is negative for ST-T wave changes.  There's been no significant arrhythmias on telemetry. Chest x-ray did show some mild interstitial edema with history of diastolic dysfunction/congestive heart failure. Echocardiogram from 2013, showed normal LV systolic function with ejection fraction 67%, moderate MR and TR, moderate aortic sclerosis and moderate pulmonary hypertension. Denies any significant fluid gain prior to hospitalization.  No PND, orthopnea.  No pedal edema. Patient is compliant with her CPAP wearing it nightly.   Physical Exam:  GEN well developed, no acute distress   HEENT pink conjunctivae   RESP normal resp effort  Decreased breath sounds bilaterally   CARD Regular rate and rhythm   ABD denies tenderness  soft   EXTR negative edema   SKIN skin turgor decreased   NEURO motor/sensory function intact   PSYCH alert, A+O to time, place, person, good insight   Review of Systems:  Subjective/Chief  Complaint Weakness, shortness of breath improved   General: Weakness   Respiratory: Short of breath   Cardiovascular: No Complaints   Gastrointestinal: No Complaints   Review of Systems: All other systems were reviewed and found to be negative   Lab Results: Routine Chem:  25-Jan-15 19:26   Result Comment TROPONIN - RESULTS VERIFIED BY REPEAT TESTING.  - CALLED TO LYNN STRICKLAND @ 2044 ON 1/25  - 2015 CAF  - READ-BACK PROCESS PERFORMED.  Result(s) reported on 24 Jun 2013 at 08:50PM.  Glucose, Serum  119  BUN  31  Creatinine (comp)  1.55  Sodium, Serum  131  Potassium, Serum 4.3  Chloride, Serum  95  CO2, Serum 31  Calcium (Total), Serum 8.8  Anion Gap  5  Osmolality (calc) 270  eGFR (African American)  35  eGFR (Non-African American)  30 (eGFR values <53m/min/1.73 m2 may be an indication of chronic kidney disease (CKD). Calculated eGFR is useful in patients with stable renal function. The eGFR calculation will not be reliable in acutely ill patients when serum creatinine is changing rapidly. It is not useful in  patients on dialysis. The eGFR calculation may not be applicable to patients at the low and high extremes of body sizes, pregnant women, and vegetarians.)  26-Jan-15 06:13   Result Comment TROPONIN - RESULTS VERIFIED BY REPEAT TESTING.  - PREVIOUS CALL:06/24/13 AT 2044..Marland KitchenMarland KitchenPL  Result(s) reported on 25 Jun 2013 at 06:59AM.  Glucose, Serum  160  BUN  31  Creatinine (comp)  1.38  Sodium, Serum  130  Potassium, Serum 4.5  Chloride, Serum  96  CO2, Serum 31  Calcium (Total), Serum 8.7  Anion Gap  3  Osmolality (calc) 271  eGFR (African American)  40  eGFR (Non-African American)  35 (eGFR values <41m/min/1.73 m2 may be an indication of chronic kidney disease (CKD). Calculated eGFR is useful in patients with stable renal function. The eGFR calculation will not be reliable in acutely ill patients when serum creatinine is changing rapidly. It is not useful in   patients on dialysis. The eGFR calculation may not be applicable to patients at the low and high extremes of body sizes, pregnant women, and vegetarians.)  Cardiac:  25-Jan-15 19:26   CK, Total 123  CPK-MB, Serum 1.0 (Result(s) reported on 25 Jun 2013 at 02:47AM.)  Troponin I  0.15 (0.00-0.05 0.05 ng/mL or less: NEGATIVE  Repeat testing in 3-6 hrs  if clinically indicated. >0.05 ng/mL: POTENTIAL  MYOCARDIAL INJURY. Repeat  testing in 3-6 hrs if  clinically indicated. NOTE: An increase or decrease  of 30% or more on serial  testing suggests a  clinically important change)  26-Jan-15 06:13   CK, Total 115  CPK-MB, Serum 1.0 (Result(s) reported on 25 Jun 2013 at 06:57AM.)  Troponin I  0.08 (0.00-0.05 0.05 ng/mL or less: NEGATIVE  Repeat testing in 3-6 hrs  if clinically indicated. >0.05 ng/mL: POTENTIAL  MYOCARDIAL INJURY. Repeat  testing in 3-6 hrs if  clinically indicated. NOTE: An increase or decrease  of 30% or more on serial  testing suggests a  clinically important change)  Routine Hem:  25-Jan-15 19:26   WBC (CBC)  13.3  RBC (CBC)  3.18  Hemoglobin (CBC)  9.1  Hematocrit (CBC)  29.1  Platelet Count (CBC) 293 (Result(s) reported on 24 Jun 2013 at 07:45PM.)  MCV 91  MCH 28.7  MCHC  31.4  RDW  16.2  26-Jan-15 06:13   WBC (CBC)  12.1  RBC (CBC)  2.85  Hemoglobin (CBC)  8.3  Hematocrit (CBC)  25.8  Platelet Count (CBC) 241  MCV 91  MCH 29.3  MCHC 32.3  RDW  15.8  Neutrophil % 96.8  Lymphocyte % 2.2  Monocyte % 0.9  Eosinophil % 0.0  Basophil % 0.1  Neutrophil #  11.7  Lymphocyte #  0.3  Monocyte #  0.1  Eosinophil # 0.0  Basophil # 0.0 (Result(s) reported on 25 Jun 2013 at 06:39AM.)    Augmentin: N/V  Doxycycline: N/V  Seldane: GI Distress  Erythromycin: GI Distress  Metformin: GI Distress  Welchol: GI Distress  Glucophage: GI Distress  Zithromax: SOB  Cipro: Cough  Januvia: GI Distress  Vital Signs/Nurse's Notes: **Vital Signs.:    26-Jan-15 05:59  Vital Signs Type Routine  Temperature Temperature (F) 98  Celsius 36.6  Temperature Source oral  Pulse Pulse 97  Respirations Respirations 20  Systolic BP Systolic BP 1629 Diastolic BP (mmHg) Diastolic BP (mmHg) 52  Mean BP 70  Pulse Ox % Pulse Ox % 94  Pulse Ox Activity Level  At rest  Oxygen Delivery 3L    Impression 81year old female with acute exacerbation of COPD with increasing weakness and shortness of breath and subsequent fall, with mildly elevated troponin, felt to be somewhat chronic, due to demand ischemia from renal insufficiency and respiratory distress from COPD.   Plan 1.  Continue treatment of exacerbation of COPD. 2. Patient does have mild interstitial edema by chest x-ray but does not appear to be acutely fluid overloaded and with her renal insufficiency,   would not recommend  diuresis at this time. 3.  No further cardiac intervention at this time.  Patient was seen in collaboration to Dr. Nehemiah Massed.   Electronic Signatures: Roderic Palau (NP)  (Signed 26-Jan-15 10:50)  Authored: General Aspect/Present Illness, History and Physical Exam, Review of System, Labs, Allergies, Vital Signs/Nurse's Notes, Impression/Plan   Last Updated: 26-Jan-15 10:50 by Roderic Palau (NP)

## 2014-09-21 NOTE — Discharge Summary (Signed)
PATIENT NAME:  Elizabeth [name], Elizabeth [name] MR#:  [id number] DATE OF BIRTH:  Nov 13, 1925  DATE OF ADMISSION:  09/01/2013 DATE OF DISCHARGE:  09/05/2013  PRIMARY CARE PHYSICIAN:  Dr. Dale Durhamharlene Scott.  FINAL DIAGNOSES: 1.  Acute on chronic respiratory failure.  2.  Acute diastolic congestive heart failure.  3.  Chronic obstructive pulmonary disease exacerbation.  4.  Sleep apnea.  5.  Chronic kidney disease, stage III.  6.  Atrial fibrillation.  7.  Diabetes.  8.  Elevated troponin.   MEDICATIONS ON DISCHARGE: Include Mucinex 600 mg every 12 hours as needed, lorazepam 0.5 mg orally 4 times a day as needed for anxiety and nervousness, Flonase nasal spray 1 spray each nostril twice a day, Spiriva 18 mcg 1 inhalation daily, albuterol CFC 2 puffs 4 times a day as needed for shortness of breath, Advair HFA 2 puffs twice a day 115 mcg per 21 mcg per inhalation; NovoLog FlexPen 5 units at breakfast, 6 units at lunch and 8 units at supper; PreserVision 1 tablet daily, doxepin 25 mg at bedtime, Wellbutrin 100 mg once a day in the morning, lisinopril 40 mg daily, Effexor-XR 75 mg daily, fenofibrate 160 mg daily, acetaminophen 650 mg every 4 hours, Protonix 40 mg daily; prednisone taper 5 mg, 5 tablets day 1, 4 tablets day 2, 3 tablets days 3, 2 tablets day 4 and 5 and then to usual dose of 1 tablet daily; Lasix 20 mg daily, diltiazem increased to 180 mg extended-release daily, aspirin 81 mg daily, ipratropium nasal spray 2 sprays twice a day both nostrils, ocular lubricant 2 drops to affected eye 4 times a day, Levaquin 250 mg once a day for 8 days. Stop taking pioglitazone.  HOME HEALTH:  Yes.  Physical therapy nurse and aide help with meds, weight and strength.  OXYGEN:  3 liters nasal cannula.   DIET: Low sodium, carbohydrate-controlled diet, regular consistency.   ACTIVITY: As tolerated.  FOLLOWUP: [facility] in 1 to 2 weeks, 1 week Dr. Dale Durhamharlene Scott.   HOSPITAL COURSE: The patient was admitted  09/01/2013 and discharged 09/05/2013, came in with shortness of breath and chest tightness, was admitted with acute on chronic respiratory failure, acute exacerbation of congestive heart failure, was given IV Lasix. The patient was started on low-dose steroids on 09/03/2013 for COPD exacerbation.  LABORATORY AND RADIOLOGICAL DATA DURING THE HOSPITAL COURSE: Included an EKG showed sinus tachycardia, premature supraventricular complexes low voltage, repolarization abnormality with ST elevation. Troponin borderline at 0.07. Glucose 67, BUN 40, creatinine 1.43. Sodium 141, potassium 4.5, chloride 102, CO2 of 37, calcium 9.1. Liver function tests normal range. White blood cell count 9.7, H and H 8.4 and 27.7, platelet count of 273, BNP 2921.  Chest x-ray showed evidence of congestive heart failure superimposed on emphysematous changes. No consolidation.   Troponin borderline at 0.08. Next, troponin borderline at 0.06. LDL 54, HDL 125, triglycerides 65.  Repeat chest x-ray on the 6th showed emphysema and mild CHF.  Creatinine upon discharge 1.4, potassium 4.0, CO2 of 42.  HOSPITAL COURSE PER PROBLEM LIST:  1.  For the patient's acute on chronic respiratory failure. The patient did have episodes of hypoxia during the hospital course and required higher levels of oxygen. I am hesitant on chronic obstructive pulmonary disease with high-level oxygen. I tapered her down to the 3 liters once I took over the course. The patient's oxygen saturation stable on 3 liters.  2.  For the patient's acute diastolic congestive heart failure. The patient was  started on IV Lasix here and then it was held and then restarted. I did start her on oral Lasix 20 mg daily to go home with. No beta blocker secondary to bronchospasm and chronic obstructive pulmonary disease exacerbation.  3.  I felt this was more chronic obstructive pulmonary disease exacerbation. When I saw her on the 7th, I started high-dose Solu-Medrol 40 mg IV every  8 hours. Lungs were much better on the 8th. I switched her over prednisone taper and will continue that. I did start Levaquin and will finish up a course of Levaquin. She is able to take Levaquin.  4.  Sleep apnea on CPAP at night.  5.  Chronic kidney disease, stage III. Need to watch creatinine with diuresis.  6.  Atrial fibrillation, on aspirin only for anticoagulation and her Cardizem CD was increased to 180 mg daily.  7.  Diabetes with history of heart failure. I did stop the pioglitazone. Continue her NovoLog FlexPen instead.  8.  Elevated troponin, likely secondary to acute on chronic respiratory failure. This is not a myocardial infarction. I repeat, this is not a myocardial infarction.   TIME SPENT ON DISCHARGE:  35 minutes.   ____________________________ Herschell Dimes. Renae Gloss, MD rjw:ce D: 09/05/2013 16:08:20 ET T: 09/05/2013 18:42:59 ET JOB#: [id number]  cc: Herschell Dimes. Renae Gloss, MD, <Dictator> Dale Export, MD [facility] Marcina Millard, MD Lamar Blinks, MD Salley Scarlet MD ELECTRONICALLY SIGNED 09/08/2013 15:53

## 2014-09-21 NOTE — H&P (Signed)
PATIENT NAME:  Elizabeth PurserBUNN, Dariana J MR#:  [id number] DATE OF BIRTH:  1926/03/24  DATE OF ADMISSION:  09/01/2013  ADDENDUM  PRIMARY CARE PHYSICIAN:  Olegario Shearerharles Scott, MD  ASSESSMENT AND PLAN:  1.  Acute hypoxic respiratory failure with acute exacerbation of diastolic congestive heart failure and acute exacerbation of chronic obstructive pulmonary disease.  Admit him to telemetry.  We will provide him intravenous Lasix. Daily weights and strict ins and outs. We will provide him nebulizer treatments and intravenous steroids. Cardiology consult is placed. Resume his home medications.  2.  Chronic renal insufficiency.  The patient is at his baseline.  We will continue close monitoring as the patient is on intravenous Lasix.  3.  Diabetes mellitus.  The patient will be on sliding scale insulin.  4.  Chronic atrial fibrillation, rate controlled, not on any blood thinners except aspirin.  5.  Chronic history of anemia, probably from chronic renal insufficiency.  6.  We will provide him gastrointestinal and deep vein thrombosis prophylaxis.  7.  He is DO NOT RESUSCITATE.  Daughter is the medical power of attorney.  We will wean his BiPAP off once the patient is clinically stable.   Total time spent on admission is 50 minutes.    ____________________________ Ramonita LabAruna Ting Cage, MD ag:ea D: 09/02/2013 00:02:35 ET T: 09/02/2013 00:59:32 ET JOB#: [id number]  cc: Ramonita LabAruna Jaymon Dudek, MD, <Dictator> Hennie Duosharles K. Lorin PicketScott, MD  Ramonita LabARUNA Kayleena Eke MD ELECTRONICALLY SIGNED 09/20/2013 7:05

## 2014-09-21 NOTE — H&P (Signed)
PATIENT NAME:  Elizabeth [name], Elizabeth [name] MR#:  [id number] DATE OF BIRTH:  May 04, 1926  DATE OF ADMISSION:  04/10/2014  PRIMARY CARE PHYSICIAN: Dale Briggs, MD  PRIMARY CARDIOLOGIST: Marcina Millard, MD  CHIEF COMPLAINT: Shortness of breath.   HISTORY OF PRESENT ILLNESS: This is an 81 year old female who has history of chronic renal insufficiency, chronic anemia, diabetes, hypertension, diastolic congestive heart failure, COPD,  and chronic respiratory failure and oxygen use of 3 liters at home with CPAP requirement at night, has gastroesophageal reflux disease. She was requiring increased oxygen at home and still was not feeling comfortable, was feeling extremely short of breath on exertion, so called ambulance and EMS noticed her oxygen saturation to be 50% on room air, on 4 liters supplementation it came up to more than 90, and so brought her to the Emergency Room. In the ER, on x-ray found having pulmonary edema. BNP was elevated. She was started on BiPAP and she is feeling better given Lasix injection, so given to hospitalist team for further management.   REVIEW OF SYSTEMS: CONSTITUTIONAL: Negative for fever, fatigue, weakness, pain or weight loss.  EYES: No blurring, double vision, discharge or redness.  EARS, NOSE, THROAT: No tinnitus, ear pain or hearing loss.  RESPIRATORY: She feels short of breath, but denies any wheezing or cough, chest pain.  CARDIOVASCULAR: No chest pain, orthopnea, edema, arrhythmia or palpitations. She denies any leg edema or denies feeling short of breath at nighttime on flat bed. GASTROINTESTINAL: No nausea, vomiting, diarrhea, abdominal pain.  GENITOURINARY: No dysuria, hematuria or increased frequency.  ENDOCRINE: No heat or cold intolerance. No excessive sweating. SKIN: No acne, rashes or lesions on the skin.  MUSCULOSKELETAL: No pain or swelling in the joints.  NEUROLOGICAL: No numbness, weakness, tremor or vertigo.  PSYCHIATRIC: No anxiety, insomnia, bipolar  disorder.   PAST MEDICAL HISTORY:  1.  Had renal insufficiency in the past, but lately her renal function has been normal with GFR more than 60.  2.  Diabetes.  3.  Hypertension.  4.  Diastolic congestive heart failure.  5.  COPD. 6.  History of breast cancer.  7.  Depression.  8.  Osteoporosis.  9.  Gastroesophageal reflux disease.  PAST SURGICAL HISTORY: Hysterectomy with oophorectomy.   PSYCHOSOCIAL HISTORY: Lives at home alone. Her son and daughter visit her and stay at nighttime interchanging, but in the daytime mostly she is alone. As per her son, she has a cane at home, but usually does not require any support. She was a smoker in the past for several years, but quit smoking a few years ago. She is on CPAP at nighttime and 3 liters oxygen in the daytime. No history of alcohol or illegal drug use.   FAMILY HISTORY: Mother had a history of hypertension. Denies any history of diabetes in the family.   HOME MEDICATIONS:  1.  Venlafaxine 75 mg once a day.  2.  Symbicort 2 puff inhalation 2 times a day.  3.  Spiriva 18 mcg inhalation once a day.  4.  Roflumilast 500 mcg oral tablet, take 1/2 tablet every other day.  5.  Protonix 40 mg oral once a day.  6.  Metoprolol 50 mg extended release once a day.  7.  Lisinopril 40 mg oral once a day.  8.  Levothyroxine 50 mcg oral once a day.  9.  Levalbuterol 3 mL inhalation every 6 hours as needed for wheezing.  10.  Letrozole 2.5 mg oral tablet once a day.  11.  Humalog 10 units subcutaneous 2 times a day with breakfast and lunch and Humalog 9 units subcutaneous in the evening.  12.  Furosemide 20 mg once a day.  13.  Fluticasone nasal spray 2 sprays once a day.  14.  Fenofibrate 160 mg oral tablet once a day.  15.  Cardizem 180 mg extended release tablet once a day.  16.  Aspirin enteric coated 81 mg once a day.  17.  Alendronate 70 mg oral once a week.  18.  Albuterol and ipratropium inhalation 4 times a day.   PHYSICAL EXAMINATION:   VITAL SIGNS: In ER, temperature 98, pulse is 76, respirations 24, blood pressure 148/99, pulse oximetry is 97 with 4 liters supplementation on BiPAP.  GENERAL: The patient is fully alert and oriented to time, place and person, slightly distress due to respiratory issues, but cooperative with history taking and physical examination.  HEENT: Head and neck atraumatic. Conjunctivae pink. Oral mucosa moist.  NECK: Supple. No JVD.  RESPIRATORY: Bilateral equal air entry present. There is crepitation. No wheezing.  CARDIOVASCULAR: S1, S2 present, regular. No murmur. No local tenderness on the chest.  ABDOMEN: Soft, nontender. Bowel sounds present. Mild distention, but no mass felt. No organomegaly SKIN: No acne, rashes or lesions.  LEGS: No edema.  NEUROLOGICAL: Power 5/5 and moves all 4 limbs, right upper, right lower, left upper and left lower limbs. Follows commands. No tremor or rigidity.  PSYCHIATRIC: No anxiety, insomnia, bipolar disorder. Does not appear in any acute distress at this time. JOINTS: No swelling or tenderness.   IMPORTANT LABORATORY RESULTS: Glucose 136. BNP is 1453. BUN 20, creatinine 0.91, sodium 140, potassium 4.2, chloride 98, CO2 is 37, calcium is 8.6.   Troponin less than 0.02. WBC 9, hemoglobin 9, platelet count is 338, MCV is 89.   Urinalysis is negative.   Chest x-ray, portable, single view: Interstitial pulmonary edema superimposed on mild congestive heart failure, suspect a small left pleural effusion.   ASSESSMENT AND PLAN: An 81 year old female who has a history of congestive heart failure, chronic obstructive pulmonary disease on home oxygen use, came to the Emergency Room after found hypoxic by EMS, for the last few days feeling short of breath. No cough.  1.  Acute on chronic respiratory failure due to acute diastolic congestive heart failure. This is secondary to her unchecked water intake daily. She is compliant with the medication. I counseled her and her son  about checking daily body weight and drinking no more than 1.5 liters total liquids in a day and decreasing salt intake in the diet. They agreed for this. For now, for this episode, she is right now on BiPAP. We will continue that for a while, but she might not need it for long and should be able to go back to nasal cannula as allowed. We was also give her IV Lasix 20 mg every 8 hours and then once she starts feeling better and having good diuresis we can switch it to oral tablet.  2.  History of chronic obstructive pulmonary disease, home oxygen use. Currently, there is no acute wheezing, so we will just continue Spiriva, Advair and albuterol nebulizer as needed basis.  3.  History of hypertension. We will continue Cardizem, lisinopril and metoprolol and she will be on Lasix.  4.  Diabetes. As she is on BiPAP, there might be limited oral intake. We will put her on just insulin sliding scale coverage without any basal insulin.   CODE STATUS: Do not  resuscitate.   The patient's healthcare power of attorney are her son and daughter and I confirmed in the presence of son in the room. The patient does not want any resuscitation.   TOTAL TIME SPENT ON THIS ADMISSION: Fifty minutes.   ____________________________ Hope Pigeon Elisabeth Pigeon, MD vgv:TT D: 04/10/2014 16:08:05 ET T: 04/10/2014 16:32:48 ET JOB#: [id number]  cc: Hope Pigeon. Elisabeth Pigeon, MD, <Dictator> Dale Bouton, MD Altamese Dilling MD ELECTRONICALLY SIGNED 04/29/2014 8:52

## 2014-09-21 NOTE — H&P (Signed)
PATIENT NAME:  Elizabeth [name], Elizabeth [name] MR#:  [id number] DATE OF BIRTH:  1925-11-27  DATE OF ADMISSION:  06/25/2013  PRIMARY CARE PHYSICIAN:  Dr. Meredeth Ide  CHIEF COMPLAINT:  Not feeling well and fall.  HISTORY OF PRESENT ILLNESS: The patient is an 81 year old female with past medical history of chronic obstructive pulmonary disease, congestive heart failure, diabetes mellitus, hypertension and breast cancer, is presenting to the ER with a chief complaint of not being well. The patient has been sick for the past two weeks and seen by primary care physician regarding worsening of her renal function. The patient was feeling short of breath for the past one week and her primary care physician, has started her on p.o. prednisone and levofloxacin. Though she has been taking medications,  her shortness of breath has been progressively worse and started feeling extremely weak. This morning while she was trying to get out of the bed to go to bathroom, she slid off the bed and fell on the floor. She denies any loss of consciousness. She did not sustain traumatic injuries.  The patient is brought into the ER for feeling weak and tired as well as for being sick. The patient is found to be short of breath in the ER. Chest x-ray did not reveal any new infiltrates or pneumonia. The patient was given IV ceftriaxone and breathing treatments. Hospitalist team is called to admit the patient. During my examination, the patient is resting comfortably and daughter is at bedside. Shortness of breath has significantly improved. She is still feeling weak. Denies any sick contacts. No similar complaints in the past.   PAST MEDICAL HISTORY: Chronic renal insufficiency, chronic anemia from renal insufficiency, diabetes mellitus, hypertension, history of breast cancer, depression and osteoporosis, GERD.    PAST SURGICAL HISTORY: Oophorectomy, hysterectomy.   ALLERGIES:  AUGMENTIN, CIPROFLOXACIN, DOXYCYCLINE, ERYTHROMYCIN, GLUCOPHAGE, JANUVIA,  METFORMIN, ZITHROMAX. THE PATIENT HE REPORTS THAT THE AUGMENTIN GIVES HER STOMACH UPSET.   PSYCHOSOCIAL HISTORY: Lives at home with her roommate. She used to smoke, but quit smoking several years ago. Denies alcohol or illicit drug use.   FAMILY HISTORY: The patient's mother had history of hypertension. Denies any diabetes mellitus.   HOME MEDICATIONS: Spiriva 18 mcg inhalation one inhalation once a day,  prednisone 10 mg tapering dose, metoprolol 40 mg p.o. once daily, Mucinex 600 mg  p.o. q. 12 hours, lisinopril 40 mg p.o. once daily, levofloxacin 500 mg q. 24 hours, furosemide 20 mg once a day, fluconazole 200 mg once a day for yeast infection as needed,  albuterol  as needed.   REVIEW OF SYSTEMS:  GENERAL: Denies any fever. Complaining of fatigue and weakness.   EYES: Denies blurry vision or double vision:   ENT:  Denies epistaxis, discharge.  RESPIRATION: Complaining of cough, shortness of breath and wheezing. She has chronic history of chronic obstructive pulmonary disease.  CARDIOVASCULAR: No chest pain, palpitations, syncope.  GASTROINTESTINAL: Denies nausea, vomiting, diarrhea.  GENITOURINARY: No dysuria or hematuria.  GYNECOLOGIC AND BREASTS: Denies breast mass, had a history of breast cancer, status post hysterectomy.  ENDOCRONE:  Denies polyuria, nocturia. Has diabetes mellitus.  HEMATOLOGIC AND LYMPHATIC: No anemia, easy bruising, bleeding.  INTEGUMENTARY: No acne, rash, lesions.  MUSCULOSKELETAL: No joint pain in the neck and back. Denies gout.  NEUROLOGIC: No vertigo, no ataxia,  PSYCHIATRIC:  No ADD, OCD.   PHYSICAL EXAMINATION: VITAL SIGNS: Temperature 98.6, pulse 97, respirations 20, blood pressure 135/52, pulse oximetry is 98%.  GENERAL APPEARANCE: Not in acute distress moderately built  and obese.  HEENT: Normocephalic, atraumatic. Pupils are equally reacting to light and accommodation. No scleral icterus. No conjunctival injection. No sinus tenderness. No postnasal  drip.  NECK: Supple. No JVD. No thyromegaly.  LUNGS: Coarse breath sounds with minimal end-expiratory wheezing. No crackles.   CARDIOVASCULAR: S1, S2 normal. Regular rhythm. Positive murmur. No peripheral edema.  GASTROINTESTINAL: Soft, obese. Bowel sounds are positive in all four quadrants. Nontender, nondistended. No hepatosplenomegaly. No masses felt.  NEUROLOGIC:  Awake, alert, oriented x 3, very hard of hearing, but follows verbal commands.  Motor and sensory are grossly intact. Cranial nerves II through XII are intact. Reflexes are 2+.  EXTREMITIES: No edema. No cyanosis. No clubbing.  SKIN: Warm to touch. Normal turgor. No rashes. No lesions.  MUSCULOSKELETAL: No joint effusion, tenderness, erythema.  PSYCHIATRIC: Normal mood and affect.   LABORATORY AND IMAGING STUDIES: Troponin 0.15, this seemed to be chronically elevated. The patient's troponin in July 2014 0.21, 0.30, 0.25, glucose 119, BUN 31, creatinine 1.55, potassium 4.3, chloride 95, CO2 31, GFR 30, anion gap is 5, calcium 8.8. Serum osmolality 270. WBC 13.3, hemoglobin 9.1, hematocrit 29.1, platelets are 293. ABG: pH 7.42, pCO2 47, pO2 122, bicarbonate 30.5, pulse oximetry is 96.8%.   Chest x-ray PA and lateral views: There is some interstitial edema.   A 12-lead EKG: Sinus tachycardia at 102 beats per minute, normal PR and QRS interval. No acute ST-T wave changes.   ASSESSMENT AND PLAN: An 81 year old obese Caucasian female presenting to the ER with a chief complaint of not being well, shortness of breath and weakness. She will be admitted with the following assessment and plan.  1.  Acute respiratory distress and acute exacerbation of chronic obstructive pulmonary disease. We will her to telemetry bed,  IV Solu-Medrol 125 mg and then 60 mg every 6 hours. DuoNeb treatments and IV Rocephin.  Taper steroids gradually.  2.  Elevated troponin, probably from acute on chronic renal insufficiency and demand ischemia from chronic  obstructive pulmonary disease exacerbation. We will cycle cardiac biomarkers and put a consult to the patient's cardiologist, Dr. Darrold JunkerParaschos.  3.  Chronic renal insufficiency. We will monitor closely and avoid nephrotoxins. If necessary, we will consult nephrology.  4.  Diabetes mellitus. The patient will be on sliding scale insulin.  5.  Hypertension. Resume home medications but hold off on ACE inhibitor in view of her renal insufficiency.  6.  Congestive heart failure. Currently the patient is not fluid overloaded. We will monitor for symptoms and signs of fluid overload. Strict Is and Os.  7.  We will provide her gastrointestinal and deep vein thrombosis prophylaxis with Protonix and heparin subcutaneous.   The diagnosis and plan of care was discussed in detail with the patient and her daughter at bedside. They both verbalized understanding of the plan.   CODE STATUS:  The patient is DO NOT RESUSCITATE. Daughter is the medical power of attorney.   TOTAL TIME SPENT ON ADMISSION:  45 minutes.   ____________________________ Ramonita LabAruna Robi Dewolfe, MD ag:cc D: 06/25/2013 00:30:45 ET T: 06/25/2013 01:12:00 ET JOB#: [id number]  cc: Ramonita LabAruna Ahnna Dungan, MD, <Dictator> Dr. Shona SimpsonFleming  Zyria Fiscus MD ELECTRONICALLY SIGNED 07/08/2013 2:12

## 2014-10-06 IMAGING — US US NEEDLE LOCALIZATION*R*
1 series · 8 of 8 positions shown · non-contrast
Comparison: none

REASON FOR EXAM: R brst 2 masses mammo after  surg [id number]   [DATE]
COMMENTS:

PROCEDURE:     US  - US GUIDED NEEDLE LOCAL R BREAST  - May 11, 2012  [DATE]
RESULT:     Comparison: Right breast ultrasound-guided biopsy 03/23/2012
TECHNIQUE: After the risks and benefits were explained the patient all questions are
answered, informed consent was obtained. The small hypoechoic mass at [DATE]
and the small round hypoechoic mass at [DATE] were identified with limited
ultrasound images. Attention was first directed to the small mass at [DATE].
Local anesthesia was provided 1 mL 1% lidocaine. The needle was then
advanced through the mass under ultrasound guidance. The wire was placed
through the needle and the needle was removed. The mass is located in the
midportion the thickener of the wire.

[Series 1: us needle localization*right* · 0.08mm/px · 8 of 8 slices shown]
[im 1/8]
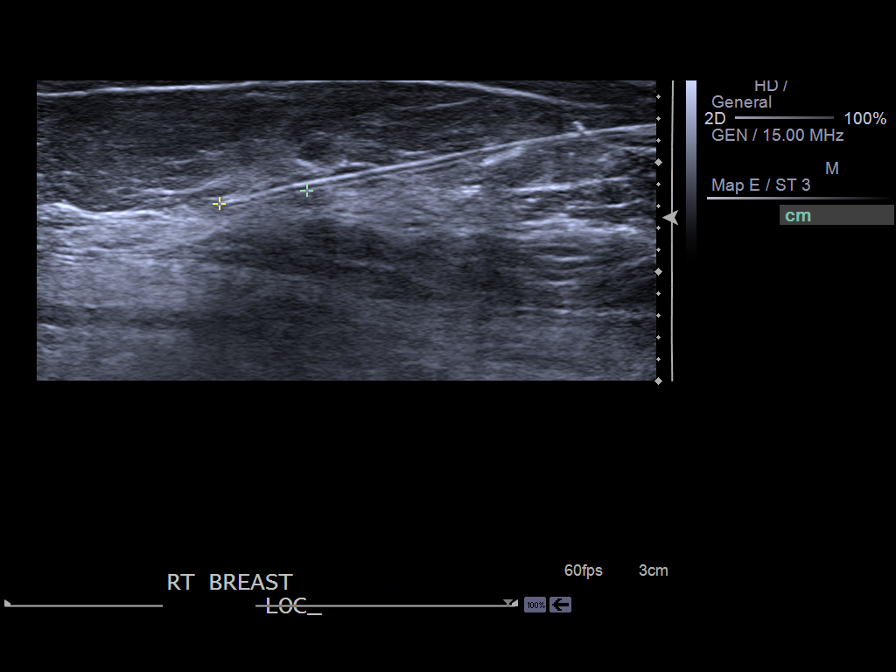
[im 2/8]
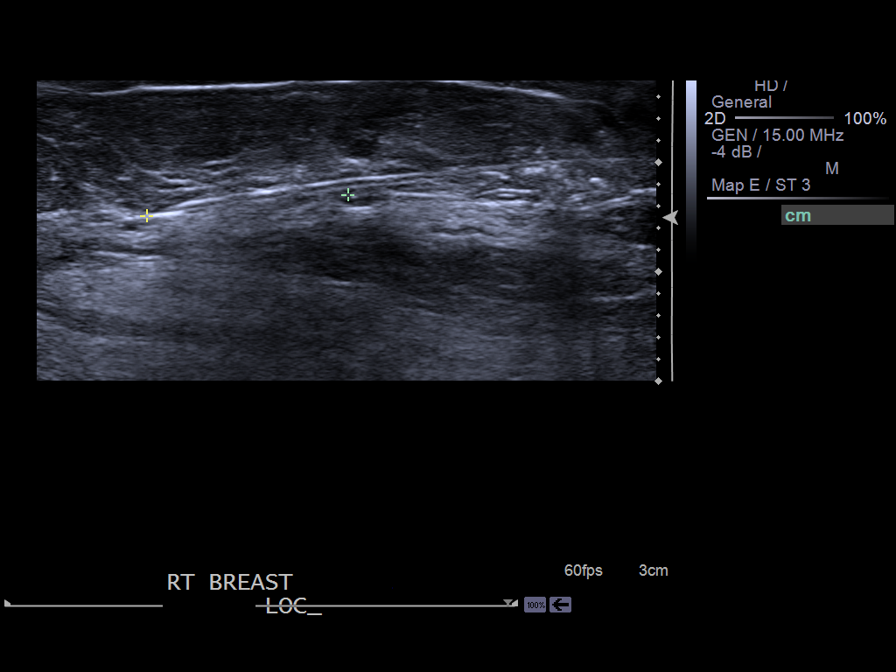
[im 3/8]
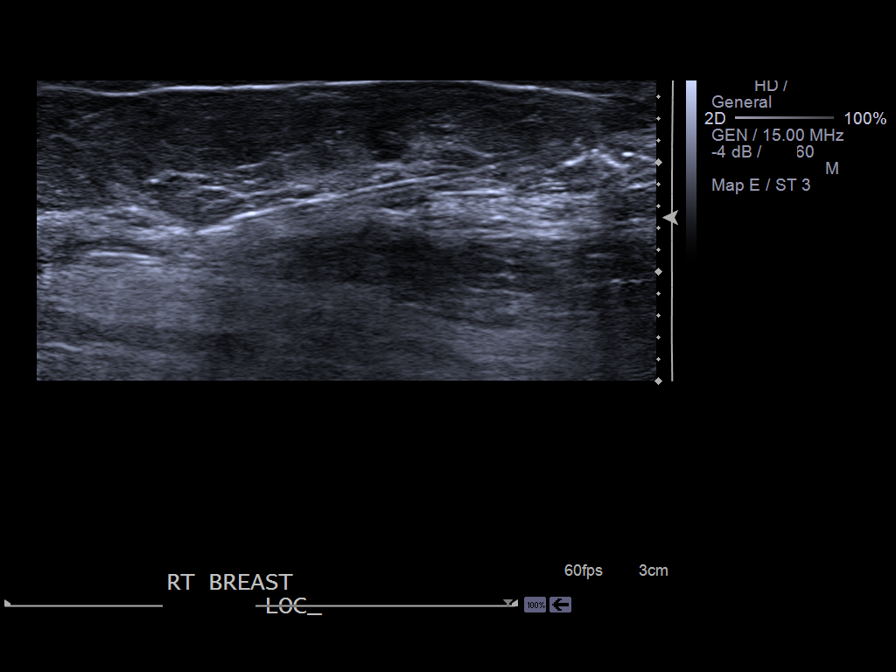
[im 4/8]
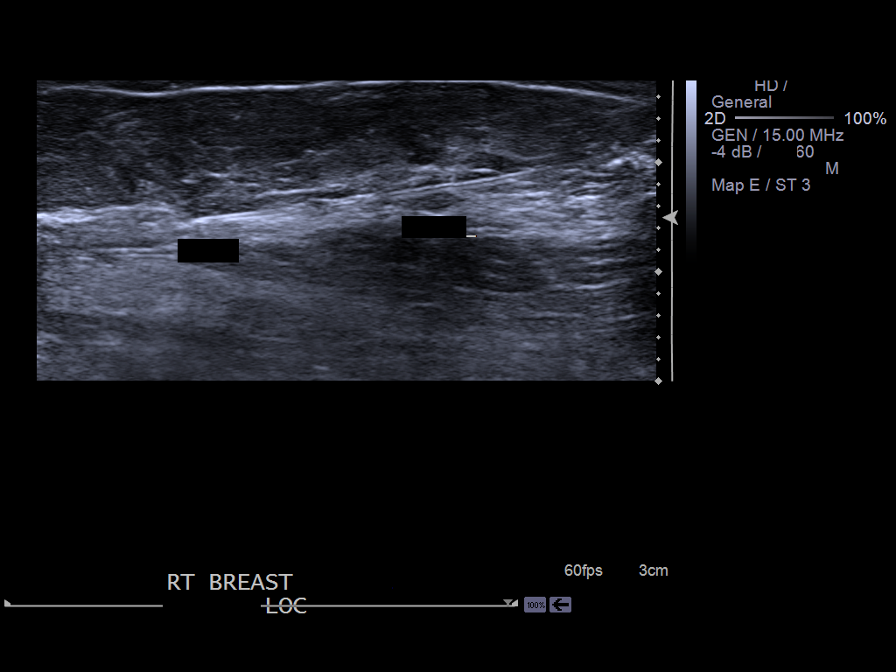
[im 5/8]
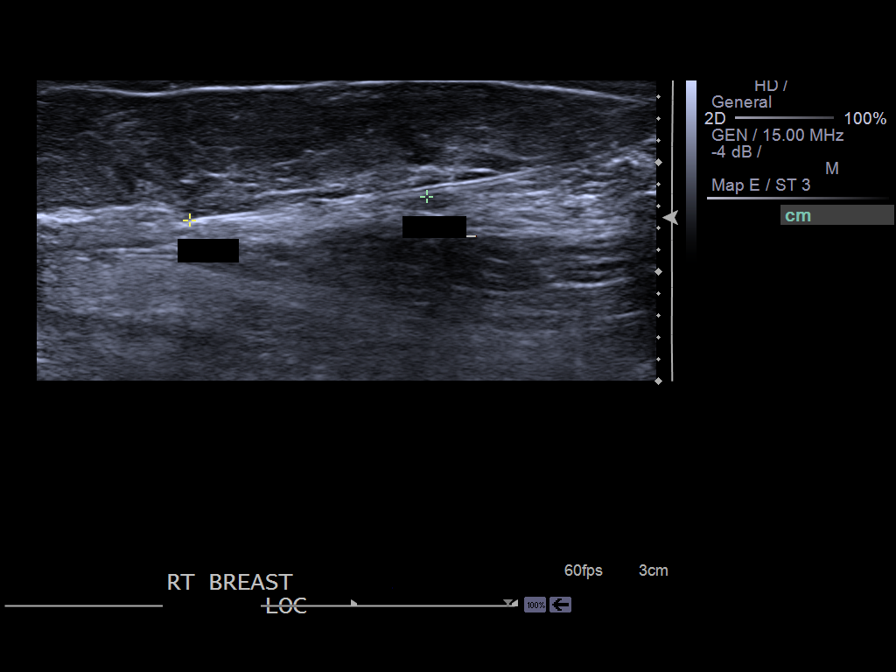
[im 6/8]
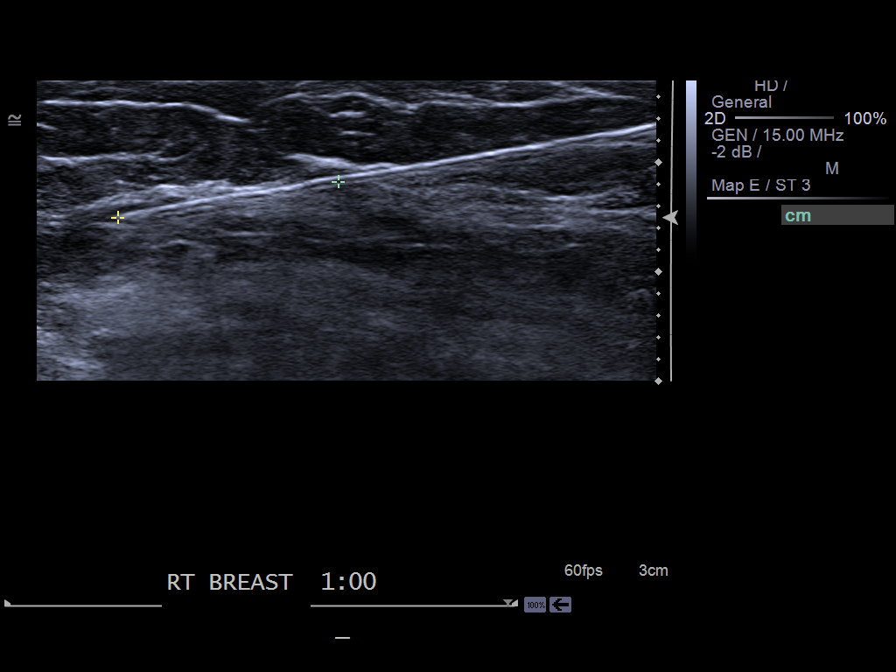
[im 7/8]
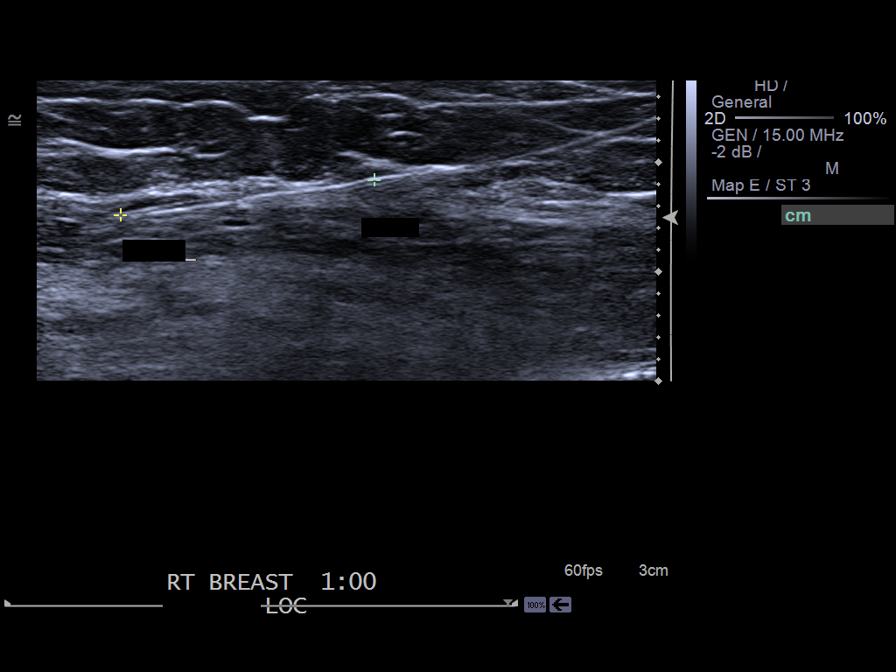
[im 8/8]
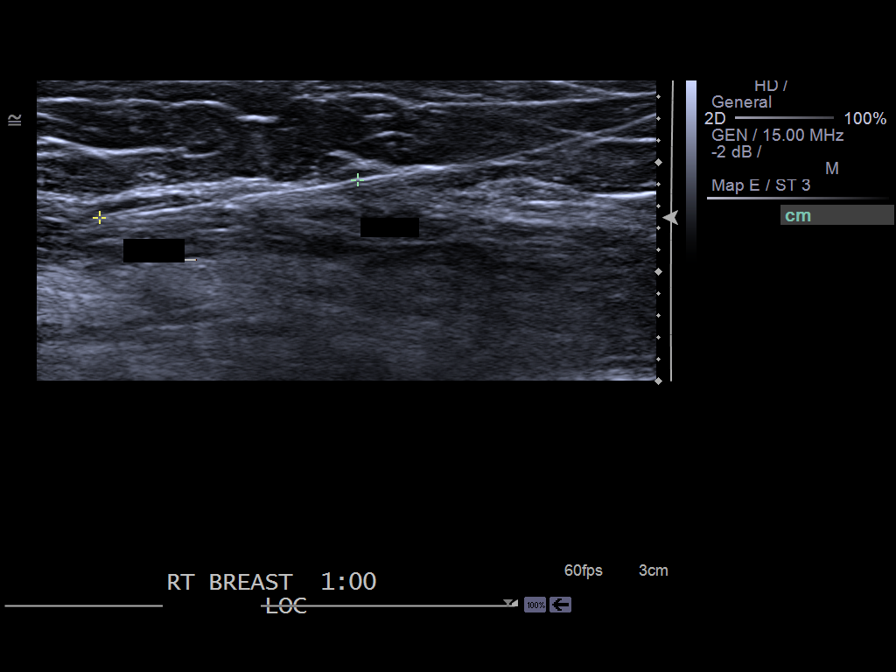

[8 of 8 positions shown; findings below may reference images not displayed]

Attention was then turned to the small hypoechoic mass at [DATE]. Local
anesthesia was provided with approximately 1 mL of 1% lidocaine. A 7 cm
Kopans needle was then advanced through the mass under ultrasound guidance.
The wire was placed through the needle and the needle was removed. The mass
is located along the proximal aspect of the thickener of the wire, 2.4 cm
from the tip of the [place] of the wire.

The wires were labeled as [DATE] and [DATE] respectively. The patient tolerated
procedure well and was transported to the preoperative location.
IMPRESSION: 1. Needle wire localization of the mass at [DATE]. The mass is located in the
midportion of the thickener of the wire.
2. Needle wire localization of the mass at [DATE]. The mass is located along
the proximal aspect of the thickener of the wire.

## 2015-05-01 IMAGING — CR DG CHEST 2V
1 series · 2 of 2 positions shown · non-contrast
Comparison: none

REASON FOR EXAM: SOB
COMMENTS:

[Series 1: pa · 0.17mm/px · 2 of 2 slices shown]
[im 1/2]
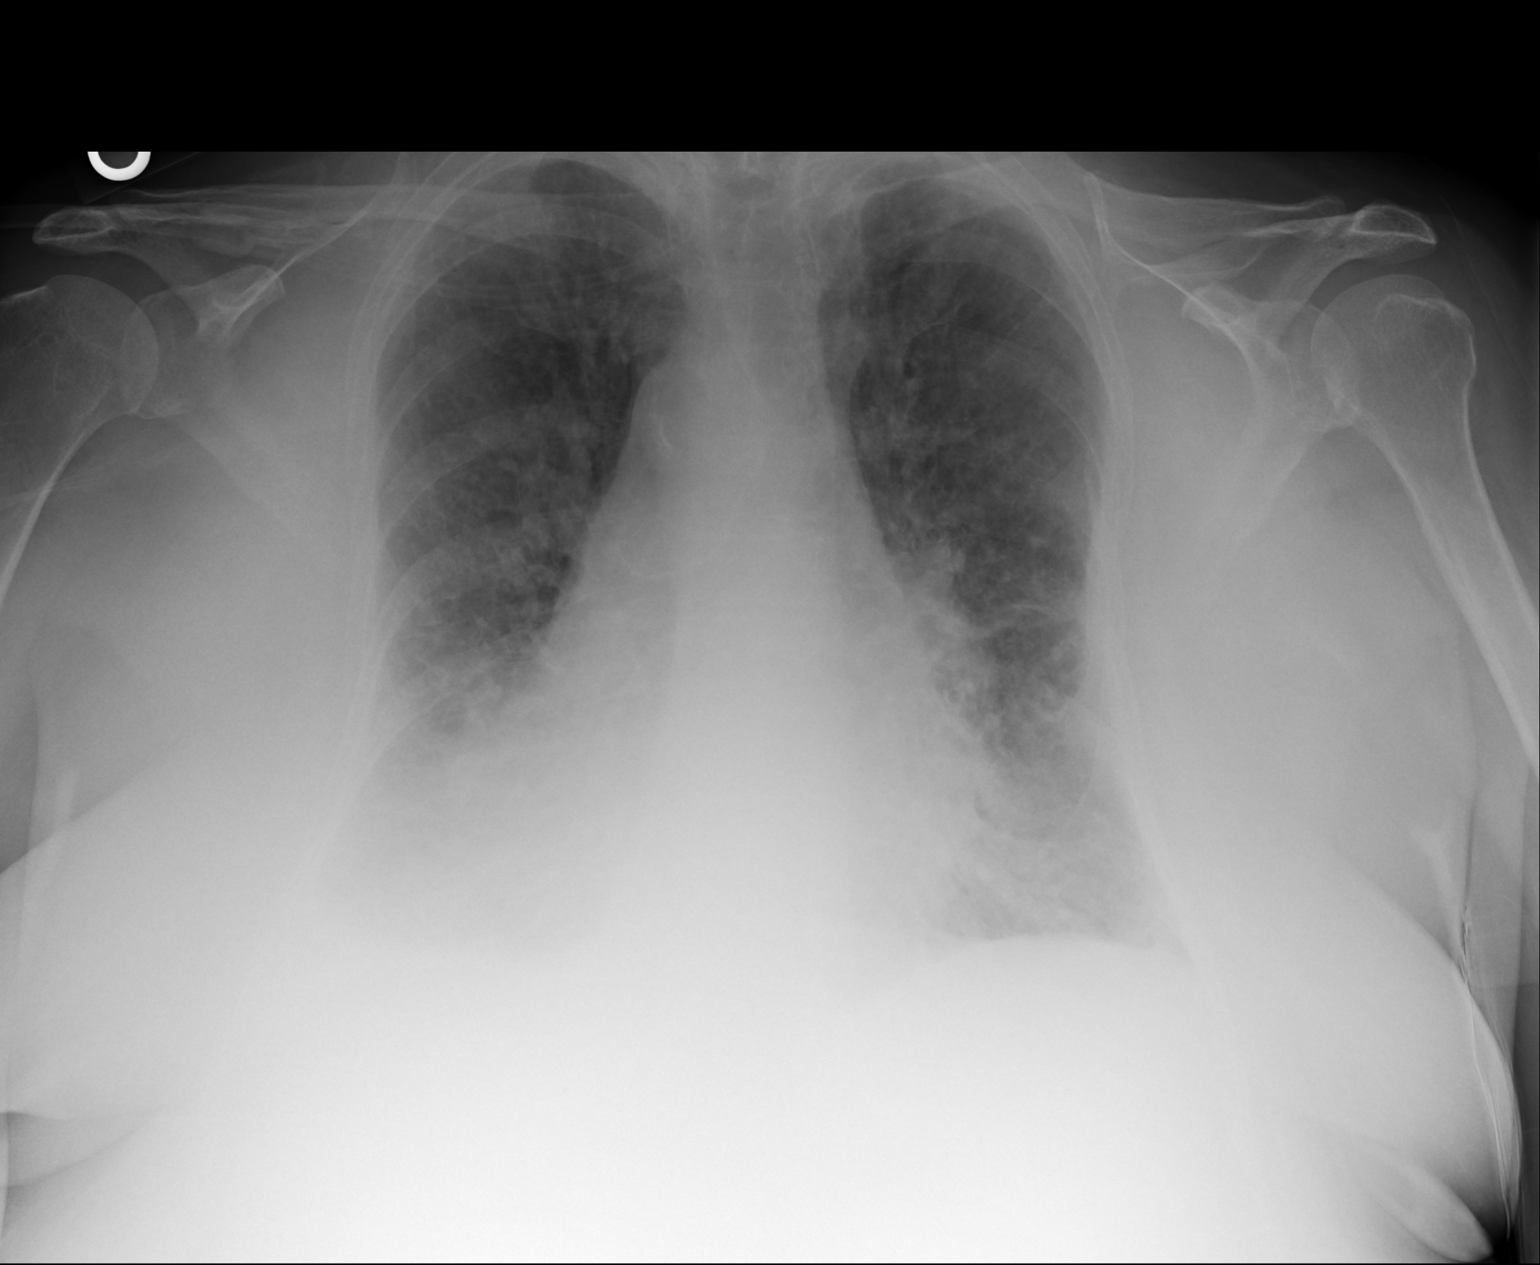
[im 2/2]
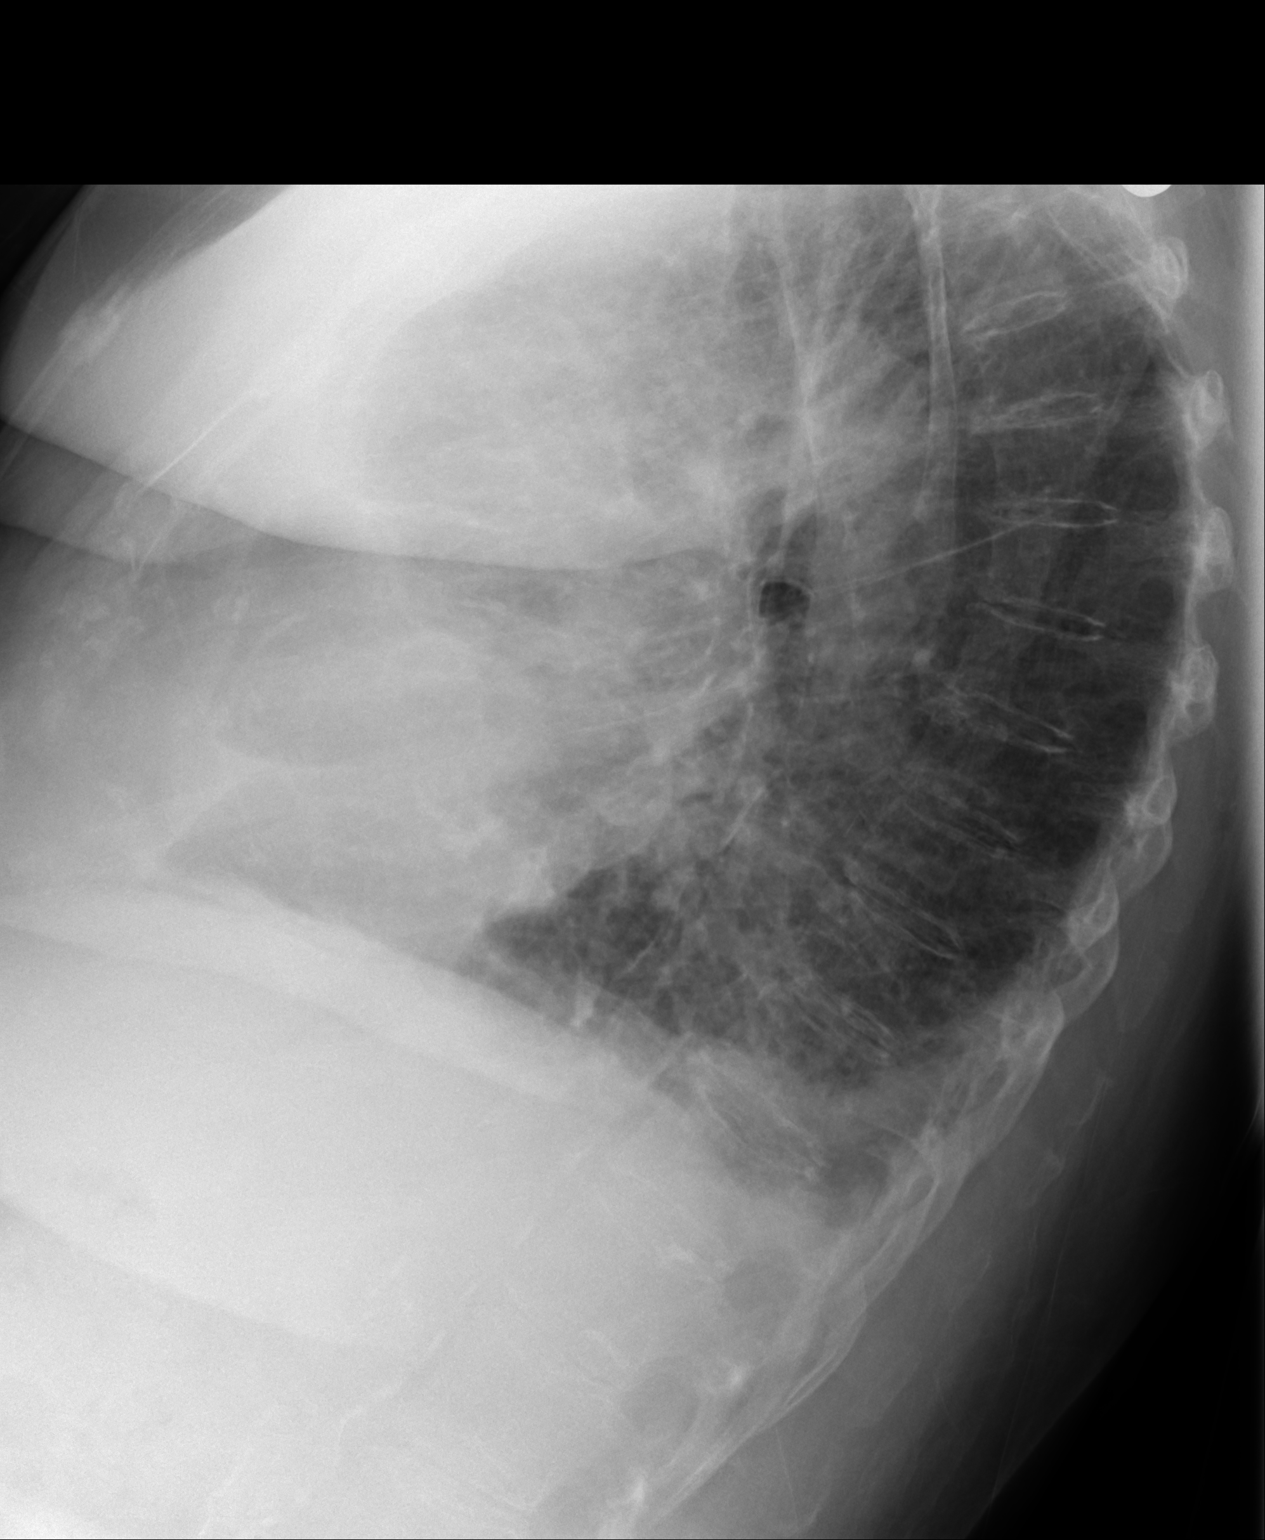

[2 of 2 positions shown; findings below may reference images not displayed]

PROCEDURE:     DXR - DXR CHEST PA (OR AP) AND LATERAL  - December 04, 2012 [DATE]

RESULT:     Comparison is made to the study of 04/06/2011. The cardiac
silhouette is enlarged. There is diffuse interstitial edema with pulmonary
vascular congestion. There is no effusion or pneumothorax. The bones are
osteopenic.
IMPRESSION: Continued cardiomegaly with changes of pulmonary edema
likely secondary to congestive heart failure.

[REDACTED]
